# Patient Record
Sex: Female | Born: 1960 | Race: White | Hispanic: No | Marital: Married | Smoking: Never smoker
Health system: Southern US, Community
[De-identification: ages and names within clinical notes are randomized; demographics above are authoritative.]

## PROBLEM LIST (undated history)

## (undated) DIAGNOSIS — E039 Hypothyroidism, unspecified: Secondary | ICD-10-CM

## (undated) DIAGNOSIS — I10 Essential (primary) hypertension: Secondary | ICD-10-CM

## (undated) DIAGNOSIS — E079 Disorder of thyroid, unspecified: Secondary | ICD-10-CM

---

## 2008-06-30 ENCOUNTER — Emergency Department (HOSPITAL_COMMUNITY): Admission: EM | Admit: 2008-06-30 | Discharge: 2008-06-30 | Payer: Self-pay | Admitting: Family Medicine

## 2011-11-11 ENCOUNTER — Other Ambulatory Visit: Payer: Self-pay | Admitting: Family Medicine

## 2011-11-11 DIAGNOSIS — Z1231 Encounter for screening mammogram for malignant neoplasm of breast: Secondary | ICD-10-CM

## 2011-12-10 ENCOUNTER — Ambulatory Visit
Admission: RE | Admit: 2011-12-10 | Discharge: 2011-12-10 | Disposition: A | Discharge status: Home / Self Care | Payer: BC Managed Care – PPO | Source: Ambulatory Visit | Attending: Family Medicine | Admitting: Family Medicine

## 2011-12-10 DIAGNOSIS — Z1231 Encounter for screening mammogram for malignant neoplasm of breast: Secondary | ICD-10-CM

## 2012-11-10 ENCOUNTER — Other Ambulatory Visit: Payer: Self-pay

## 2012-11-10 DIAGNOSIS — Z1231 Encounter for screening mammogram for malignant neoplasm of breast: Secondary | ICD-10-CM

## 2012-12-12 ENCOUNTER — Ambulatory Visit
Admission: RE | Admit: 2012-12-12 | Discharge: 2012-12-12 | Disposition: A | Discharge status: Home / Self Care | Payer: BC Managed Care – PPO | Source: Ambulatory Visit

## 2012-12-12 DIAGNOSIS — Z1231 Encounter for screening mammogram for malignant neoplasm of breast: Secondary | ICD-10-CM

## 2013-01-05 ENCOUNTER — Other Ambulatory Visit: Payer: Self-pay | Admitting: Family Medicine

## 2013-01-05 ENCOUNTER — Other Ambulatory Visit (HOSPITAL_COMMUNITY)
Admission: RE | Admit: 2013-01-05 | Discharge: 2013-01-05 | Disposition: A | Discharge status: Home / Self Care | Payer: BC Managed Care – PPO | Source: Ambulatory Visit | Attending: Family Medicine | Admitting: Family Medicine

## 2013-01-05 DIAGNOSIS — Z1151 Encounter for screening for human papillomavirus (HPV): Secondary | ICD-10-CM | POA: Insufficient documentation

## 2013-01-05 DIAGNOSIS — Z01419 Encounter for gynecological examination (general) (routine) without abnormal findings: Secondary | ICD-10-CM | POA: Insufficient documentation

## 2013-11-30 ENCOUNTER — Other Ambulatory Visit: Payer: Self-pay

## 2013-11-30 DIAGNOSIS — Z1231 Encounter for screening mammogram for malignant neoplasm of breast: Secondary | ICD-10-CM

## 2013-12-14 ENCOUNTER — Ambulatory Visit
Admission: RE | Admit: 2013-12-14 | Discharge: 2013-12-14 | Disposition: A | Discharge status: Home / Self Care | Payer: BC Managed Care – PPO | Source: Ambulatory Visit

## 2013-12-14 DIAGNOSIS — Z1231 Encounter for screening mammogram for malignant neoplasm of breast: Secondary | ICD-10-CM

## 2015-01-23 ENCOUNTER — Other Ambulatory Visit: Payer: Self-pay

## 2015-01-23 DIAGNOSIS — Z1231 Encounter for screening mammogram for malignant neoplasm of breast: Secondary | ICD-10-CM

## 2015-02-25 ENCOUNTER — Ambulatory Visit: Payer: Self-pay

## 2015-03-04 ENCOUNTER — Ambulatory Visit
Admission: RE | Admit: 2015-03-04 | Discharge: 2015-03-04 | Disposition: A | Discharge status: Home / Self Care | Payer: BLUE CROSS/BLUE SHIELD | Source: Ambulatory Visit

## 2015-03-04 DIAGNOSIS — Z1231 Encounter for screening mammogram for malignant neoplasm of breast: Secondary | ICD-10-CM

## 2015-10-24 DIAGNOSIS — M4317 Spondylolisthesis, lumbosacral region: Secondary | ICD-10-CM | POA: Diagnosis not present

## 2015-10-28 DIAGNOSIS — M4317 Spondylolisthesis, lumbosacral region: Secondary | ICD-10-CM | POA: Diagnosis not present

## 2015-10-31 DIAGNOSIS — M4317 Spondylolisthesis, lumbosacral region: Secondary | ICD-10-CM | POA: Diagnosis not present

## 2015-11-05 DIAGNOSIS — M4317 Spondylolisthesis, lumbosacral region: Secondary | ICD-10-CM | POA: Diagnosis not present

## 2015-11-08 DIAGNOSIS — M4317 Spondylolisthesis, lumbosacral region: Secondary | ICD-10-CM | POA: Diagnosis not present

## 2015-11-12 DIAGNOSIS — M4317 Spondylolisthesis, lumbosacral region: Secondary | ICD-10-CM | POA: Diagnosis not present

## 2015-11-19 DIAGNOSIS — M4317 Spondylolisthesis, lumbosacral region: Secondary | ICD-10-CM | POA: Diagnosis not present

## 2015-11-22 DIAGNOSIS — M4317 Spondylolisthesis, lumbosacral region: Secondary | ICD-10-CM | POA: Diagnosis not present

## 2015-11-26 DIAGNOSIS — M4317 Spondylolisthesis, lumbosacral region: Secondary | ICD-10-CM | POA: Diagnosis not present

## 2015-12-10 DIAGNOSIS — M4317 Spondylolisthesis, lumbosacral region: Secondary | ICD-10-CM | POA: Diagnosis not present

## 2015-12-17 DIAGNOSIS — M4317 Spondylolisthesis, lumbosacral region: Secondary | ICD-10-CM | POA: Diagnosis not present

## 2015-12-25 DIAGNOSIS — M4317 Spondylolisthesis, lumbosacral region: Secondary | ICD-10-CM | POA: Diagnosis not present

## 2016-01-08 DIAGNOSIS — M4317 Spondylolisthesis, lumbosacral region: Secondary | ICD-10-CM | POA: Diagnosis not present

## 2016-01-17 DIAGNOSIS — E782 Mixed hyperlipidemia: Secondary | ICD-10-CM | POA: Diagnosis not present

## 2016-01-17 DIAGNOSIS — I10 Essential (primary) hypertension: Secondary | ICD-10-CM | POA: Diagnosis not present

## 2016-01-17 DIAGNOSIS — E559 Vitamin D deficiency, unspecified: Secondary | ICD-10-CM | POA: Diagnosis not present

## 2016-01-23 DIAGNOSIS — E559 Vitamin D deficiency, unspecified: Secondary | ICD-10-CM | POA: Diagnosis not present

## 2016-01-23 DIAGNOSIS — E782 Mixed hyperlipidemia: Secondary | ICD-10-CM | POA: Diagnosis not present

## 2016-01-23 DIAGNOSIS — I10 Essential (primary) hypertension: Secondary | ICD-10-CM | POA: Diagnosis not present

## 2016-01-23 DIAGNOSIS — Z0000 Encounter for general adult medical examination without abnormal findings: Secondary | ICD-10-CM | POA: Diagnosis not present

## 2016-01-29 ENCOUNTER — Other Ambulatory Visit: Payer: Self-pay | Admitting: Family Medicine

## 2016-01-29 DIAGNOSIS — Z1231 Encounter for screening mammogram for malignant neoplasm of breast: Secondary | ICD-10-CM

## 2016-03-04 ENCOUNTER — Ambulatory Visit
Admission: RE | Admit: 2016-03-04 | Discharge: 2016-03-04 | Disposition: A | Discharge status: Home / Self Care | Payer: BLUE CROSS/BLUE SHIELD | Source: Ambulatory Visit | Attending: Family Medicine | Admitting: Family Medicine

## 2016-03-04 DIAGNOSIS — Z1231 Encounter for screening mammogram for malignant neoplasm of breast: Secondary | ICD-10-CM | POA: Diagnosis not present

## 2016-07-30 DIAGNOSIS — L570 Actinic keratosis: Secondary | ICD-10-CM | POA: Diagnosis not present

## 2016-07-30 DIAGNOSIS — D229 Melanocytic nevi, unspecified: Secondary | ICD-10-CM | POA: Diagnosis not present

## 2016-07-30 DIAGNOSIS — L309 Dermatitis, unspecified: Secondary | ICD-10-CM | POA: Diagnosis not present

## 2016-07-30 DIAGNOSIS — L821 Other seborrheic keratosis: Secondary | ICD-10-CM | POA: Diagnosis not present

## 2016-07-30 DIAGNOSIS — L719 Rosacea, unspecified: Secondary | ICD-10-CM | POA: Diagnosis not present

## 2017-01-22 DIAGNOSIS — E559 Vitamin D deficiency, unspecified: Secondary | ICD-10-CM | POA: Diagnosis not present

## 2017-01-22 DIAGNOSIS — I10 Essential (primary) hypertension: Secondary | ICD-10-CM | POA: Diagnosis not present

## 2017-01-22 DIAGNOSIS — E782 Mixed hyperlipidemia: Secondary | ICD-10-CM | POA: Diagnosis not present

## 2017-01-27 ENCOUNTER — Other Ambulatory Visit: Payer: Self-pay | Admitting: Family Medicine

## 2017-01-27 DIAGNOSIS — Z139 Encounter for screening, unspecified: Secondary | ICD-10-CM

## 2017-01-29 ENCOUNTER — Other Ambulatory Visit: Payer: Self-pay | Admitting: Family Medicine

## 2017-01-29 ENCOUNTER — Other Ambulatory Visit (HOSPITAL_COMMUNITY)
Admission: RE | Admit: 2017-01-29 | Discharge: 2017-01-29 | Disposition: A | Discharge status: Home / Self Care | Payer: BLUE CROSS/BLUE SHIELD | Source: Ambulatory Visit | Attending: Family Medicine | Admitting: Family Medicine

## 2017-01-29 DIAGNOSIS — Z124 Encounter for screening for malignant neoplasm of cervix: Secondary | ICD-10-CM | POA: Insufficient documentation

## 2017-01-29 DIAGNOSIS — F3342 Major depressive disorder, recurrent, in full remission: Secondary | ICD-10-CM | POA: Diagnosis not present

## 2017-01-29 DIAGNOSIS — Z780 Asymptomatic menopausal state: Secondary | ICD-10-CM | POA: Diagnosis not present

## 2017-01-29 DIAGNOSIS — Z0000 Encounter for general adult medical examination without abnormal findings: Secondary | ICD-10-CM | POA: Diagnosis not present

## 2017-01-29 DIAGNOSIS — E782 Mixed hyperlipidemia: Secondary | ICD-10-CM | POA: Diagnosis not present

## 2017-01-29 DIAGNOSIS — Z1211 Encounter for screening for malignant neoplasm of colon: Secondary | ICD-10-CM | POA: Diagnosis not present

## 2017-01-29 DIAGNOSIS — I10 Essential (primary) hypertension: Secondary | ICD-10-CM | POA: Diagnosis not present

## 2017-02-02 LAB — CYTOLOGY - PAP: DIAGNOSIS: NEGATIVE

## 2017-02-10 DIAGNOSIS — Z1211 Encounter for screening for malignant neoplasm of colon: Secondary | ICD-10-CM | POA: Diagnosis not present

## 2017-03-05 ENCOUNTER — Ambulatory Visit
Admission: RE | Admit: 2017-03-05 | Discharge: 2017-03-05 | Disposition: A | Discharge status: Home / Self Care | Payer: BLUE CROSS/BLUE SHIELD | Source: Ambulatory Visit | Attending: Family Medicine | Admitting: Family Medicine

## 2017-03-05 ENCOUNTER — Encounter: Payer: Self-pay | Admitting: Radiology

## 2017-03-05 DIAGNOSIS — Z1231 Encounter for screening mammogram for malignant neoplasm of breast: Secondary | ICD-10-CM | POA: Diagnosis not present

## 2017-03-05 DIAGNOSIS — Z139 Encounter for screening, unspecified: Secondary | ICD-10-CM

## 2017-03-25 ENCOUNTER — Other Ambulatory Visit (HOSPITAL_COMMUNITY)
Admission: RE | Admit: 2017-03-25 | Discharge: 2017-03-25 | Disposition: A | Discharge status: Home / Self Care | Payer: BLUE CROSS/BLUE SHIELD | Source: Ambulatory Visit | Attending: Family Medicine | Admitting: Family Medicine

## 2017-03-25 ENCOUNTER — Other Ambulatory Visit: Payer: Self-pay | Admitting: Family Medicine

## 2017-03-25 DIAGNOSIS — R87615 Unsatisfactory cytologic smear of cervix: Secondary | ICD-10-CM | POA: Diagnosis not present

## 2017-03-29 LAB — CYTOLOGY - PAP: DIAGNOSIS: NEGATIVE

## 2017-08-12 DIAGNOSIS — L719 Rosacea, unspecified: Secondary | ICD-10-CM | POA: Diagnosis not present

## 2017-08-12 DIAGNOSIS — D485 Neoplasm of uncertain behavior of skin: Secondary | ICD-10-CM | POA: Diagnosis not present

## 2017-08-12 DIAGNOSIS — D229 Melanocytic nevi, unspecified: Secondary | ICD-10-CM | POA: Diagnosis not present

## 2017-09-01 DIAGNOSIS — L509 Urticaria, unspecified: Secondary | ICD-10-CM | POA: Diagnosis not present

## 2017-09-05 DIAGNOSIS — Z6827 Body mass index (BMI) 27.0-27.9, adult: Secondary | ICD-10-CM | POA: Diagnosis not present

## 2017-09-05 DIAGNOSIS — M546 Pain in thoracic spine: Secondary | ICD-10-CM | POA: Diagnosis not present

## 2018-02-03 ENCOUNTER — Other Ambulatory Visit: Payer: Self-pay | Admitting: Family Medicine

## 2018-02-03 DIAGNOSIS — Z1231 Encounter for screening mammogram for malignant neoplasm of breast: Secondary | ICD-10-CM

## 2018-03-15 DIAGNOSIS — Z1211 Encounter for screening for malignant neoplasm of colon: Secondary | ICD-10-CM | POA: Diagnosis not present

## 2018-03-15 DIAGNOSIS — F3342 Major depressive disorder, recurrent, in full remission: Secondary | ICD-10-CM | POA: Diagnosis not present

## 2018-03-15 DIAGNOSIS — Z780 Asymptomatic menopausal state: Secondary | ICD-10-CM | POA: Diagnosis not present

## 2018-03-15 DIAGNOSIS — E559 Vitamin D deficiency, unspecified: Secondary | ICD-10-CM | POA: Diagnosis not present

## 2018-03-15 DIAGNOSIS — I10 Essential (primary) hypertension: Secondary | ICD-10-CM | POA: Diagnosis not present

## 2018-03-15 DIAGNOSIS — Z0000 Encounter for general adult medical examination without abnormal findings: Secondary | ICD-10-CM | POA: Diagnosis not present

## 2018-03-15 DIAGNOSIS — E782 Mixed hyperlipidemia: Secondary | ICD-10-CM | POA: Diagnosis not present

## 2018-03-17 ENCOUNTER — Ambulatory Visit: Payer: BLUE CROSS/BLUE SHIELD

## 2018-03-24 ENCOUNTER — Other Ambulatory Visit: Payer: Self-pay | Admitting: Family Medicine

## 2018-03-24 DIAGNOSIS — M8580 Other specified disorders of bone density and structure, unspecified site: Secondary | ICD-10-CM

## 2018-04-19 ENCOUNTER — Ambulatory Visit: Payer: BLUE CROSS/BLUE SHIELD

## 2018-05-17 ENCOUNTER — Other Ambulatory Visit: Payer: BLUE CROSS/BLUE SHIELD

## 2018-05-17 ENCOUNTER — Ambulatory Visit: Payer: BLUE CROSS/BLUE SHIELD

## 2018-07-15 ENCOUNTER — Other Ambulatory Visit: Payer: BLUE CROSS/BLUE SHIELD

## 2018-07-15 ENCOUNTER — Ambulatory Visit: Payer: BLUE CROSS/BLUE SHIELD

## 2018-10-07 ENCOUNTER — Ambulatory Visit
Admission: RE | Admit: 2018-10-07 | Discharge: 2018-10-07 | Disposition: A | Discharge status: Home / Self Care | Payer: BC Managed Care – PPO | Source: Ambulatory Visit | Attending: Family Medicine | Admitting: Family Medicine

## 2018-10-07 ENCOUNTER — Other Ambulatory Visit: Payer: Self-pay

## 2018-10-07 DIAGNOSIS — M8589 Other specified disorders of bone density and structure, multiple sites: Secondary | ICD-10-CM | POA: Diagnosis not present

## 2018-10-07 DIAGNOSIS — Z780 Asymptomatic menopausal state: Secondary | ICD-10-CM | POA: Diagnosis not present

## 2018-10-07 DIAGNOSIS — Z1231 Encounter for screening mammogram for malignant neoplasm of breast: Secondary | ICD-10-CM

## 2018-10-07 DIAGNOSIS — M8580 Other specified disorders of bone density and structure, unspecified site: Secondary | ICD-10-CM

## 2018-10-10 ENCOUNTER — Ambulatory Visit: Payer: BLUE CROSS/BLUE SHIELD

## 2018-10-10 ENCOUNTER — Other Ambulatory Visit: Payer: BLUE CROSS/BLUE SHIELD

## 2018-11-15 DIAGNOSIS — E782 Mixed hyperlipidemia: Secondary | ICD-10-CM | POA: Diagnosis not present

## 2018-11-15 DIAGNOSIS — R7989 Other specified abnormal findings of blood chemistry: Secondary | ICD-10-CM | POA: Diagnosis not present

## 2018-12-29 DIAGNOSIS — L03116 Cellulitis of left lower limb: Secondary | ICD-10-CM | POA: Diagnosis not present

## 2019-02-07 ENCOUNTER — Other Ambulatory Visit: Payer: Self-pay | Admitting: Dermatology

## 2019-02-07 DIAGNOSIS — D485 Neoplasm of uncertain behavior of skin: Secondary | ICD-10-CM | POA: Diagnosis not present

## 2019-02-07 DIAGNOSIS — L570 Actinic keratosis: Secondary | ICD-10-CM | POA: Diagnosis not present

## 2019-02-07 DIAGNOSIS — L52 Erythema nodosum: Secondary | ICD-10-CM | POA: Diagnosis not present

## 2019-03-30 DIAGNOSIS — E782 Mixed hyperlipidemia: Secondary | ICD-10-CM | POA: Diagnosis not present

## 2019-03-30 DIAGNOSIS — E559 Vitamin D deficiency, unspecified: Secondary | ICD-10-CM | POA: Diagnosis not present

## 2019-03-30 DIAGNOSIS — R7989 Other specified abnormal findings of blood chemistry: Secondary | ICD-10-CM | POA: Diagnosis not present

## 2019-04-04 DIAGNOSIS — Z23 Encounter for immunization: Secondary | ICD-10-CM | POA: Diagnosis not present

## 2019-04-04 DIAGNOSIS — I10 Essential (primary) hypertension: Secondary | ICD-10-CM | POA: Diagnosis not present

## 2019-04-04 DIAGNOSIS — Z0001 Encounter for general adult medical examination with abnormal findings: Secondary | ICD-10-CM | POA: Diagnosis not present

## 2019-04-04 DIAGNOSIS — F3342 Major depressive disorder, recurrent, in full remission: Secondary | ICD-10-CM | POA: Diagnosis not present

## 2019-04-04 DIAGNOSIS — E782 Mixed hyperlipidemia: Secondary | ICD-10-CM | POA: Diagnosis not present

## 2019-04-04 DIAGNOSIS — E039 Hypothyroidism, unspecified: Secondary | ICD-10-CM | POA: Diagnosis not present

## 2019-04-05 DIAGNOSIS — Z1211 Encounter for screening for malignant neoplasm of colon: Secondary | ICD-10-CM | POA: Diagnosis not present

## 2019-05-16 DIAGNOSIS — I10 Essential (primary) hypertension: Secondary | ICD-10-CM | POA: Diagnosis not present

## 2019-05-16 DIAGNOSIS — E039 Hypothyroidism, unspecified: Secondary | ICD-10-CM | POA: Diagnosis not present

## 2019-06-01 ENCOUNTER — Ambulatory Visit: Payer: BC Managed Care – PPO | Attending: Internal Medicine

## 2019-06-01 DIAGNOSIS — Z23 Encounter for immunization: Secondary | ICD-10-CM | POA: Insufficient documentation

## 2019-06-01 NOTE — Progress Notes (Signed)
   Covid-19 Vaccination Clinic  Name:  Belinda Miller    MRN: [id number] DOB: 1960/11/06  06/01/2019  Belinda Miller was observed post Covid-19 immunization for 15 minutes without incident. She was provided with Vaccine Information Sheet and instruction to access the V-Safe system.   Belinda Miller was instructed to call 911 with any severe reactions post vaccine: Marland Kitchen Difficulty breathing  . Swelling of face and throat  . A fast heartbeat  . A bad rash all over body  . Dizziness and weakness   Immunizations Administered    Name Date Dose VIS Date Route   Moderna COVID-19 Vaccine 06/01/2019 10:07 AM 0.5 mL 02/28/2019 Intramuscular   Manufacturer: Moderna   Lot: [id number]   [id number]

## 2019-06-20 ENCOUNTER — Other Ambulatory Visit: Payer: Self-pay

## 2019-06-20 ENCOUNTER — Ambulatory Visit (INDEPENDENT_AMBULATORY_CARE_PROVIDER_SITE_OTHER): Payer: BC Managed Care – PPO | Admitting: Physician Assistant

## 2019-06-20 ENCOUNTER — Encounter: Payer: Self-pay | Admitting: Physician Assistant

## 2019-06-20 DIAGNOSIS — D492 Neoplasm of unspecified behavior of bone, soft tissue, and skin: Secondary | ICD-10-CM

## 2019-06-20 DIAGNOSIS — C4491 Basal cell carcinoma of skin, unspecified: Secondary | ICD-10-CM

## 2019-06-20 DIAGNOSIS — L710 Perioral dermatitis: Secondary | ICD-10-CM | POA: Diagnosis not present

## 2019-06-20 DIAGNOSIS — L2084 Intrinsic (allergic) eczema: Secondary | ICD-10-CM

## 2019-06-20 DIAGNOSIS — C4441 Basal cell carcinoma of skin of scalp and neck: Secondary | ICD-10-CM

## 2019-06-20 HISTORY — DX: Basal cell carcinoma of skin, unspecified: C44.91

## 2019-06-20 MED ORDER — MINOCYCLINE HCL 100 MG PO CAPS: 100.0000 mg | ORAL_CAPSULE | Freq: Two times a day (BID) | ORAL | 3 refills | Status: DC

## 2019-06-20 MED ORDER — TRIAMCINOLONE ACETONIDE 0.1 % EX CREA: 1.0000 "application " | TOPICAL_CREAM | Freq: Two times a day (BID) | CUTANEOUS | 6 refills | Status: AC

## 2019-06-20 NOTE — Progress Notes (Signed)
Refill minoccyline 100mg  #60 bid  due to face broke out again and it worked in the past. Legs itchy treatment otc lotion x months.

## 2019-06-20 NOTE — Progress Notes (Signed)
   Follow-Up Visit   Subjective  Belinda Miller is a 59 y.o. female who presents for the following: Skin Problem (Face acne refill minocycline 100 mg #60 bid. Also legs itchy otc lotion x months).   Location: legs mostle Duration: months Quality: pink scales scattered  Associated Signs/Symptoms: Modifying Factors: lotion not helping Severity: little less since taking synthroid Timing:worse at night Context: hypothyroidism   The following portions of the chart were reviewed this encounter and updated as appropriate: Tobacco  Allergies  Meds  Problems  Med Hx  Surg Hx  Fam Hx      Objective  Well appearing patient in no apparent distress; mood and affect are within normal limits.  A full examination was performed including scalp, head, eyes, ears, nose, lips, neck, chest, axillae, abdomen, back, buttocks, bilateral upper extremities, bilateral lower extremities, hands, feet, fingers, toes, fingernails, and toenails. All findings within normal limits unless otherwise noted below.  Left Lower Leg - Anterior; Right Lower Leg - Anterior (2); Left Lower Leg - Posterior (2)  Objective  Left Lower Leg - Posterior: Xerosis with pink patches  Left Lower Leg - Posterior: Xerosis with pink patches  Left Lower Leg - Anterior: Xerosis with pink patches  Right Lower Leg - Anterior: Xerosis with pink patches  Right Lower Leg - Anterior: Xerosis with pink patches  Objective  Left side neck: Pearly papule with telangectasia.      Objective  Right Buccal Cheek  (2), Right Lower Cutaneous Lip, Right Upper Cutaneous Lip: Pink papules in patch.6-8 wks   Assessment & Plan  Intrinsic (allergic) eczema (5) Left Lower Leg - Anterior; Right Lower Leg - Anterior (2); Left Lower Leg - Posterior (2)  Neoplasm of skin Left side neck  Skin / nail biopsy Type of biopsy: tangential   Informed consent: discussed and consent obtained   Timeout: patient name, date of birth, surgical  site, and procedure verified   Anesthesia: the lesion was anesthetized in a standard fashion   Anesthetic:  1% lidocaine w/ epinephrine 1-100,000 local infiltration Instrument used: flexible razor blade   Hemostasis achieved with: aluminum chloride and electrodesiccation   Outcome: patient tolerated procedure well   Post-procedure details: wound care instructions given    Specimen 1 - Surgical pathology Differential Diagnosis: BCC Check Margins: No  Perioral dermatitis (4) Right Upper Cutaneous Lip; Right Lower Cutaneous Lip; Right Buccal Cheek  (2)  minocycline (MINOCIN) 100 MG capsule - Right Buccal Cheek  (2), Right Lower Cutaneous Lip, Right Upper Cutaneous Lip

## 2019-06-20 NOTE — Patient Instructions (Signed)

## 2019-06-22 ENCOUNTER — Telehealth: Payer: Self-pay

## 2019-06-22 NOTE — Telephone Encounter (Signed)
Path results to patient and 72minute surgery with [facility] scheduled for 07/28/2019 @ 11:30am.

## 2019-06-22 NOTE — Telephone Encounter (Signed)
-----   Message from Warren Danes, [place] sent at 06/22/2019  9:49 AM EDT ----- BASAL CELL CARCINOMA, NODULAR PATTERN- neck  Schedule  30   ED&C

## 2019-07-04 ENCOUNTER — Ambulatory Visit: Payer: BC Managed Care – PPO | Attending: Internal Medicine

## 2019-07-04 DIAGNOSIS — Z23 Encounter for immunization: Secondary | ICD-10-CM

## 2019-07-04 NOTE — Progress Notes (Signed)
   Covid-19 Vaccination Clinic  Name:  Belinda Miller    MRN: [id number] DOB: Jan 15, 1961  07/04/2019  [name] was observed post Covid-19 immunization for 15 minutes without incident. She was provided with Vaccine Information Sheet and instruction to access the V-Safe system.    [name] was instructed to call 911 with any severe reactions post vaccine: Marland Kitchen Difficulty breathing  . Swelling of face and throat  . A fast heartbeat  . A bad rash all over body  . Dizziness and weakness   Immunizations Administered    Name Date Dose VIS Date Route   Moderna COVID-19 Vaccine 07/04/2019  8:53 AM 0.5 mL 02/28/2019 Intramuscular   Manufacturer: Moderna   [id number]   [place] [id number]

## 2019-07-10 ENCOUNTER — Telehealth: Payer: Self-pay

## 2019-07-10 NOTE — Telephone Encounter (Signed)
Patient called stating she will be losing her insurance on 07/21/2019 and she has an appointment with Robyne Askew on 07/28/2019 for treatment of Belinda Miller.  Patient wants to know if there's anyway Vida Roller can get her in before 07/21/2019.  I informed patient I couldn't double book Kelli's appointments I would have to speak with [facility] on tomorrow when she's back in the office.  Patient okay with that.

## 2019-07-11 NOTE — Telephone Encounter (Signed)
Phone call to patient per [facility] request to get her seen sooner.  Appointment made for 07/20/2019 @1pm .  Patient aware of appointment.

## 2019-07-11 NOTE — Telephone Encounter (Signed)
Find her a spot at end of day. Not fridays please

## 2019-07-20 ENCOUNTER — Encounter: Payer: Self-pay | Admitting: *Deleted

## 2019-07-20 ENCOUNTER — Encounter: Payer: Self-pay | Admitting: Physician Assistant

## 2019-07-20 ENCOUNTER — Ambulatory Visit (INDEPENDENT_AMBULATORY_CARE_PROVIDER_SITE_OTHER): Payer: BC Managed Care – PPO | Admitting: Physician Assistant

## 2019-07-20 ENCOUNTER — Other Ambulatory Visit: Payer: Self-pay

## 2019-07-20 DIAGNOSIS — C4441 Basal cell carcinoma of skin of scalp and neck: Secondary | ICD-10-CM | POA: Diagnosis not present

## 2019-07-20 NOTE — Patient Instructions (Signed)

## 2019-07-20 NOTE — Progress Notes (Addendum)
   Follow-Up Visit   Subjective  Belinda Miller is a 59 y.o. female who presents for the following: Basal Cell Carcinoma (here for treatment- left side neck- bcc). Pink spot. Healed well. No problems  The following portions of the chart were reviewed this encounter and updated as appropriate: Meds  Problems      Objective  Well appearing patient in no apparent distress; mood and affect are within normal limits.  A focused examination was performed including neck. Relevant physical exam findings are noted in the Assessment and Plan.  Objective  Left side Neck: 1.4 cm pearly papule with arborizing vessels.   Assessment & Plan  Basal cell carcinoma (BCC) of skin of neck Left side Neck  Epidermal / dermal shaving  Informed consent: discussed and consent obtained   Timeout: patient name, date of birth, surgical site, and procedure verified   Procedure prep:  Patient was prepped and draped in usual sterile fashion Prep type:  Chlorhexidine Anesthesia: the lesion was anesthetized in a standard fashion   Anesthetic:  1% lidocaine w/ epinephrine 1-100,000 local infiltration Instrument used: flexible razor blade   Outcome: patient tolerated procedure well   Post-procedure details: wound care instructions given    Specimen 1 - Surgical pathology Differential Diagnosis:  Check Margins: No 1.4 cm pearly papule with arborizing vessels.

## 2019-07-28 ENCOUNTER — Encounter: Payer: BC Managed Care – PPO | Admitting: Physician Assistant

## 2019-08-07 NOTE — Addendum Note (Signed)
Addended by: Robyne Askew R on: 08/07/2019 12:07 PM   Modules accepted: Orders, Level of Service

## 2019-08-22 ENCOUNTER — Ambulatory Visit (INDEPENDENT_AMBULATORY_CARE_PROVIDER_SITE_OTHER): Payer: Self-pay

## 2019-08-22 ENCOUNTER — Ambulatory Visit (HOSPITAL_COMMUNITY)
Admission: EM | Admit: 2019-08-22 | Discharge: 2019-08-22 | Disposition: A | Discharge status: Home / Self Care | Payer: Self-pay | Attending: Emergency Medicine | Admitting: Emergency Medicine

## 2019-08-22 ENCOUNTER — Other Ambulatory Visit: Payer: Self-pay

## 2019-08-22 ENCOUNTER — Encounter (HOSPITAL_COMMUNITY): Payer: Self-pay | Admitting: Orthopedic Surgery

## 2019-08-22 DIAGNOSIS — R079 Chest pain, unspecified: Secondary | ICD-10-CM | POA: Insufficient documentation

## 2019-08-22 DIAGNOSIS — J029 Acute pharyngitis, unspecified: Secondary | ICD-10-CM | POA: Insufficient documentation

## 2019-08-22 DIAGNOSIS — Z79899 Other long term (current) drug therapy: Secondary | ICD-10-CM | POA: Insufficient documentation

## 2019-08-22 DIAGNOSIS — J189 Pneumonia, unspecified organism: Secondary | ICD-10-CM

## 2019-08-22 DIAGNOSIS — R5383 Other fatigue: Secondary | ICD-10-CM | POA: Insufficient documentation

## 2019-08-22 DIAGNOSIS — I10 Essential (primary) hypertension: Secondary | ICD-10-CM | POA: Insufficient documentation

## 2019-08-22 DIAGNOSIS — Z7901 Long term (current) use of anticoagulants: Secondary | ICD-10-CM | POA: Insufficient documentation

## 2019-08-22 DIAGNOSIS — R509 Fever, unspecified: Secondary | ICD-10-CM | POA: Insufficient documentation

## 2019-08-22 DIAGNOSIS — Z20822 Contact with and (suspected) exposure to covid-19: Secondary | ICD-10-CM | POA: Insufficient documentation

## 2019-08-22 DIAGNOSIS — H9209 Otalgia, unspecified ear: Secondary | ICD-10-CM | POA: Insufficient documentation

## 2019-08-22 DIAGNOSIS — R531 Weakness: Secondary | ICD-10-CM | POA: Insufficient documentation

## 2019-08-22 DIAGNOSIS — E039 Hypothyroidism, unspecified: Secondary | ICD-10-CM | POA: Insufficient documentation

## 2019-08-22 HISTORY — DX: Disorder of thyroid, unspecified: E07.9

## 2019-08-22 HISTORY — DX: Essential (primary) hypertension: I10

## 2019-08-22 LAB — POCT RAPID STREP A: Streptococcus, Group A Screen (Direct): NEGATIVE

## 2019-08-22 MED ORDER — IBUPROFEN 600 MG PO TABS: 600.0000 mg | ORAL_TABLET | Freq: Four times a day (QID) | ORAL | 0 refills | Status: DC | PRN

## 2019-08-22 MED ORDER — AZITHROMYCIN 250 MG PO TABS: 250.0000 mg | ORAL_TABLET | Freq: Every day | ORAL | 0 refills | Status: DC

## 2019-08-22 MED ORDER — AMOXICILLIN-POT CLAVULANATE 875-125 MG PO TABS: 1.0000 | ORAL_TABLET | Freq: Two times a day (BID) | ORAL | 0 refills | Status: AC

## 2019-08-22 MED ORDER — BENZONATATE 200 MG PO CAPS: 200.0000 mg | ORAL_CAPSULE | Freq: Three times a day (TID) | ORAL | 0 refills | Status: AC | PRN

## 2019-08-22 NOTE — Discharge Instructions (Signed)
There was signs of early pneumonia on your chest x-ray Begin Augmentin twice daily for the next week and azithromycin-2 tablets today, 1 tablet for the following 4 days Continue Tylenol and ibuprofen to help with any fevers, body aches and chest discomfort Rest and drink plenty of fluids Honey and hot tea

## 2019-08-22 NOTE — ED Provider Notes (Signed)
[name]    CSN: [id number] Arrival date & time: 08/22/19  [id number]      History   Chief Complaint Chief Complaint  Patient presents with  . Fever  . Sore Throat  . Chest Pain  . Fatigue  Fever, Fatigue, Sore throat  HPI Belinda Miller is a 58 y.o. female history of hypertension, hypothyroid, presenting today for evaluation of fever, fatigue and sore throat.  Patient reports that yesterday she woke up with a discomfort in her chest and back.  Initially related this to a muscle strain as she was carrying a heavy bag of dirt and working in the yard prior to this.  Since she has also developed significant fatigue and has felt generalized weakness and sore throat.  She denies significant rhinorrhea or cough.  Has had some slight ear pain beginning today.  Has been fully vaccinated for Covid.  Denies GI symptoms.  HPI  Past Medical History:  Diagnosis Date  . Basal cell carcinoma 06/20/2019   left side neck(CX35FU)  . Hypertension   . Thyroid disease     There are no problems to display for this patient.   History reviewed. No pertinent surgical history.  OB History   No obstetric history on file.      Home Medications    Prior to Admission medications   Medication Sig Start Date End Date Taking? Authorizing Provider  calcium carbonate (OSCAL) 1500 (600 Ca) MG TABS tablet Take by mouth 2 (two) times daily with a meal.   Yes [provider]  cholecalciferol (VITAMIN D3) 25 MCG (1000 UNIT) tablet Take 1,000 Units by mouth daily.   Yes [provider]  levothyroxine (SYNTHROID) 50 MCG tablet Take 50 mcg by mouth daily before breakfast.   Yes [provider]  lisinopril (ZESTRIL) 10 MG tablet Take 10 mg by mouth daily.   Yes [provider]  minocycline (MINOCIN) 100 MG capsule Take 1 capsule (100 mg total) by mouth in the morning and at bedtime. 06/20/19  Yes Sheffield, Kelli R, PA-C  PARoxetine (PAXIL) 20 MG tablet Take 20 mg by  mouth daily.   Yes [provider]  raloxifene (EVISTA) 60 MG tablet Take 60 mg by mouth daily.   Yes [provider]  simvastatin (ZOCOR) 20 MG tablet Take 20 mg by mouth daily.   Yes [provider]  amoxicillin-clavulanate (AUGMENTIN) 875-125 MG tablet Take 1 tablet by mouth every 12 (twelve) hours for 7 days. 08/22/19 08/29/19  Wieters, Hallie C, PA-C  azithromycin (ZITHROMAX) 250 MG tablet Take 1 tablet (250 mg total) by mouth daily. Take first 2 tablets together, then 1 every day until finished. 08/22/19   Wieters, Hallie C, PA-C  benzonatate (TESSALON) 200 MG capsule Take 1 capsule (200 mg total) by mouth 3 (three) times daily as needed for up to 7 days for cough. 08/22/19 08/29/19  Wieters, Hallie C, PA-C  ibuprofen (ADVIL) 600 MG tablet Take 1 tablet (600 mg total) by mouth every 6 (six) hours as needed. 08/22/19   Wieters, Hallie C, PA-C  Omega-3 Fatty Acids (FISH OIL) 500 MG CAPS Take by mouth.    [provider]  triamcinolone cream (KENALOG) 0.1 % Apply 1 application topically 2 (two) times daily. 06/20/19   Warren Danes, PA-C    Family History Family History  Problem Relation Age of Onset  . Cancer Mother   . Cancer Father     Social History Social History   Tobacco  Use  . Smoking status: Never Smoker  . Smokeless tobacco: Never Used  Substance Use Topics  . Alcohol use: Yes    Alcohol/week: 1.0 standard drinks    Types: 1 Glasses of wine per week  . Drug use: Never     Allergies   Patient has no known allergies.   Review of Systems Review of Systems  Constitutional: Positive for chills, fatigue and fever. Negative for activity change and appetite change.  HENT: Positive for sore throat. Negative for congestion, ear pain, rhinorrhea, sinus pressure and trouble swallowing.   Eyes: Negative for discharge and redness.  Respiratory: Positive for cough. Negative for chest tightness and shortness of breath.   Cardiovascular: Positive  for chest pain.  Gastrointestinal: Negative for abdominal pain, diarrhea, nausea and vomiting.  Musculoskeletal: Positive for myalgias.  Skin: Negative for rash.  Neurological: Negative for dizziness, light-headedness and headaches.     Physical Exam Triage Vital Signs ED Triage Vitals  Enc Vitals Group     BP 08/22/19 0848 (!) 157/78     Pulse Rate 08/22/19 0848 (!) 102     Resp 08/22/19 0848 18     Temp 08/22/19 0848 100.1 F (37.8 C)     Temp Source 08/22/19 0848 Oral     SpO2 08/22/19 0848 100 %     Weight --      Height --      Head Circumference --      Peak Flow --      Pain Score 08/22/19 0849 7     Pain Loc --      Pain Edu? --      Excl. in [place]? --    No data found.  Updated Vital Signs BP (!) 157/78 (BP Location: Right Arm)   Pulse (!) 102   Temp 100.1 F (37.8 C) (Oral)   Resp 18   SpO2 100%   Visual Acuity Right Eye Distance:   Left Eye Distance:   Bilateral Distance:    Right Eye Near:   Left Eye Near:    Bilateral Near:     Physical Exam Vitals and nursing note reviewed.  Constitutional:      Appearance: She is well-developed.     Comments: No acute distress  HENT:     Head: Normocephalic and atraumatic.     Ears:     Comments: Bilateral ears without tenderness to palpation of external auricle, tragus and mastoid, EAC's without erythema or swelling, TM's with good bony landmarks and cone of light. Non erythematous.     Nose: Nose normal.     Mouth/Throat:     Comments: Oral mucosa pink and moist, no tonsillar enlargement or exudate. Posterior pharynx patent and nonerythematous, no uvula deviation or swelling. Normal phonation. Eyes:     Conjunctiva/sclera: Conjunctivae normal.  Neck:     Comments: Full active ROM Cardiovascular:     Rate and Rhythm: Normal rate.  Pulmonary:     Effort: Pulmonary effort is normal. No respiratory distress.     Comments: Breathing comfortably at rest, CTABL, no wheezing, rales or other adventitious sounds  auscultated  No reproducible tenderness to palpation Abdominal:     General: There is no distension.  Musculoskeletal:        General: Normal range of motion.     Cervical back: Neck supple.  Skin:    General: Skin is warm and dry.  Neurological:     Mental Status: She is alert and oriented to person,  place, and time.      UC Treatments / Results  Labs (all labs ordered are listed, but only abnormal results are displayed) Labs Reviewed  SARS CORONAVIRUS 2 (TAT 6-24 HRS)  CULTURE, GROUP A STREP [facility])  POCT RAPID STREP A    EKG   Radiology DG Chest 2 View  Result Date: 08/22/2019 CLINICAL DATA:  Fever fatigue chest and back discomfort. EXAM: CHEST - 2 VIEW COMPARISON:  None. FINDINGS: Cardiomediastinal contours and hilar structures are normal. Streaky opacities at the lung bases LEFT greater than RIGHT particularly in the lingula. No sign of pleural effusion or dense consolidation. Visualized skeletal structures are unremarkable to the extent evaluated. IMPRESSION: Streaky opacities at the lung bases LEFT greater than RIGHT, atelectasis or developing pneumonia, particularly in the lingula. Electronically Signed   By: Zetta Bills M.D.   On: 08/22/2019 09:45    Procedures Procedures (including critical care time)  Medications Ordered in UC Medications - No data to display  Initial Impression / Assessment and Plan / UC Course  I have reviewed the triage vital signs and the nursing notes.  Pertinent labs & imaging results that were available during my care of the patient were reviewed by me and considered in my medical decision making (see chart for details).     Chest x-ray concerning for possible early pneumonia, will go ahead and start on Augmentin and azithromycin, will provide Tessalon for cough as needed, continue Tylenol and ibuprofen, rest and push fluids.  Strep test negative, sore throat more likely viral versus related to postnasal drainage.  Covid PCR pending.   Discussed strict return precautions. Patient verbalized understanding and is agreeable with plan.  Final Clinical Impressions(s) / UC Diagnoses   Final diagnoses:  Community acquired pneumonia of left lower lobe of lung     Discharge Instructions     There was signs of early pneumonia on your chest x-ray Begin Augmentin twice daily for the next week and azithromycin-2 tablets today, 1 tablet for the following 4 days Continue Tylenol and ibuprofen to help with any fevers, body aches and chest discomfort Rest and drink plenty of fluids Honey and hot tea    ED Prescriptions    Medication Sig Dispense Auth. Provider   amoxicillin-clavulanate (AUGMENTIN) 875-125 MG tablet Take 1 tablet by mouth every 12 (twelve) hours for 7 days. 14 tablet Wieters, Hallie C, PA-C   azithromycin (ZITHROMAX) 250 MG tablet Take 1 tablet (250 mg total) by mouth daily. Take first 2 tablets together, then 1 every day until finished. 6 tablet Wieters, Hallie C, PA-C   ibuprofen (ADVIL) 600 MG tablet Take 1 tablet (600 mg total) by mouth every 6 (six) hours as needed. 30 tablet Wieters, Hallie C, PA-C   benzonatate (TESSALON) 200 MG capsule Take 1 capsule (200 mg total) by mouth 3 (three) times daily as needed for up to 7 days for cough. 28 capsule Wieters, [place] C, PA-C     PDMP not reviewed this encounter.   Wieters, [place] C, PA-C 08/22/19 1012

## 2019-08-22 NOTE — ED Triage Notes (Addendum)
Pt reports yesterday when she woke up she was SOB,chest /back soreness with inspiration, chills and sore throat. Pt feels weak and tired. General malaise.  A little runny nose, today she has a headache. No n/v/d, abdominal pain, cough. Pt reports fever of 101 this morning.   Pt took Tylenol yesterday, but none today.

## 2019-08-23 LAB — SARS CORONAVIRUS 2 (TAT 6-24 HRS): SARS Coronavirus 2: NEGATIVE

## 2019-08-24 ENCOUNTER — Telehealth: Payer: Self-pay | Admitting: Physician Assistant

## 2019-08-24 LAB — CULTURE, GROUP A STREP (THRC)

## 2019-08-24 NOTE — Telephone Encounter (Signed)
Patient reports that she was seen in the [facility] office on July 21, 2019 for a biopsy and no information is showing on my chart, and says her my chart shows something posted on Aug 07, 2019 which patient reports she was not here on this day.  Patient is asking that someone call her back to clear this up. Chart [id number]

## 2019-08-25 NOTE — Telephone Encounter (Signed)
PATIENT AWARE SPOT TREATED BCC  NO REPORT/PATH TO GIVE.

## 2019-09-03 ENCOUNTER — Encounter (HOSPITAL_COMMUNITY): Payer: Self-pay

## 2019-09-03 ENCOUNTER — Ambulatory Visit (HOSPITAL_COMMUNITY)
Admission: EM | Admit: 2019-09-03 | Discharge: 2019-09-03 | Disposition: A | Discharge status: Home / Self Care | Payer: BC Managed Care – PPO | Attending: Family Medicine | Admitting: Family Medicine

## 2019-09-03 ENCOUNTER — Ambulatory Visit (INDEPENDENT_AMBULATORY_CARE_PROVIDER_SITE_OTHER): Payer: BC Managed Care – PPO

## 2019-09-03 ENCOUNTER — Other Ambulatory Visit: Payer: Self-pay

## 2019-09-03 DIAGNOSIS — M8580 Other specified disorders of bone density and structure, unspecified site: Secondary | ICD-10-CM

## 2019-09-03 DIAGNOSIS — E039 Hypothyroidism, unspecified: Secondary | ICD-10-CM

## 2019-09-03 DIAGNOSIS — F418 Other specified anxiety disorders: Secondary | ICD-10-CM

## 2019-09-03 DIAGNOSIS — I10 Essential (primary) hypertension: Secondary | ICD-10-CM

## 2019-09-03 DIAGNOSIS — R05 Cough: Secondary | ICD-10-CM

## 2019-09-03 DIAGNOSIS — R0981 Nasal congestion: Secondary | ICD-10-CM | POA: Diagnosis not present

## 2019-09-03 DIAGNOSIS — R0989 Other specified symptoms and signs involving the circulatory and respiratory systems: Secondary | ICD-10-CM

## 2019-09-03 DIAGNOSIS — J069 Acute upper respiratory infection, unspecified: Secondary | ICD-10-CM | POA: Insufficient documentation

## 2019-09-03 DIAGNOSIS — E785 Hyperlipidemia, unspecified: Secondary | ICD-10-CM

## 2019-09-03 LAB — CBC WITH DIFFERENTIAL/PLATELET
Abs Immature Granulocytes: 0.03 10*3/uL (ref 0.00–0.07)
Basophils Absolute: 0 10*3/uL (ref 0.0–0.1)
Basophils Relative: 1 %
Eosinophils Absolute: 0.1 10*3/uL (ref 0.0–0.5)
Eosinophils Relative: 2 %
HCT: 39.8 % (ref 36.0–46.0)
Hemoglobin: 12.9 g/dL (ref 12.0–15.0)
Immature Granulocytes: 0 %
Lymphocytes Relative: 15 %
Lymphs Abs: 1.1 10*3/uL (ref 0.7–4.0)
MCH: 29.7 pg (ref 26.0–34.0)
MCHC: 32.4 g/dL (ref 30.0–36.0)
MCV: 91.5 fL (ref 80.0–100.0)
Monocytes Absolute: 0.5 10*3/uL (ref 0.1–1.0)
Monocytes Relative: 7 %
Neutro Abs: 5.4 10*3/uL (ref 1.7–7.7)
Neutrophils Relative %: 75 %
Platelets: 248 10*3/uL (ref 150–400)
RBC: 4.35 MIL/uL (ref 3.87–5.11)
RDW: 13.5 % (ref 11.5–15.5)
WBC: 7.2 10*3/uL (ref 4.0–10.5)
nRBC: 0 % (ref 0.0–0.2)

## 2019-09-03 NOTE — Discharge Instructions (Addendum)
Add flonase 2 x a day for a week then once a day until well Eat well and try to get enough rest Drink lots of water Call your PCP if not improving in a week

## 2019-09-03 NOTE — ED Provider Notes (Signed)
[place]    CSN: [id number] Arrival date & time: 09/03/19  1002      History   Chief Complaint Chief Complaint  Patient presents with  . Chest Pain    HPI Belinda Miller is a 59 y.o. female.   HPI Patient was seen on 08/22/2019 for community-acquired pneumonia.  She states she woke up suddenly that morning with chest pain and and congestion.  She was given Augmentin and azithromycin.  She took both of these medications.  She felt improved but not completely well for a few days.  She is back today because now she has sinus congestion and pressure.  Right greater than left side of her face.  Ear pressure and pain on the right ear.  Fatigue.  No fever or chills.  No cough or shortness of breath.  She still has some chest pressure and pain in the center of her chest is worse with a deep breath.  She expresses concern that she still does not feel well after her pneumonia.  I did explain to her the pneumonia is a deep infection, and can make her tired for a good month.  Past Medical History:  Diagnosis Date  . Basal cell carcinoma 06/20/2019   left side neck(CX35FU)  . Hypertension   . Thyroid disease     Patient Active Problem List   Diagnosis Date Noted  . Essential hypertension 09/03/2019  . HLD (hyperlipidemia) 09/03/2019  . Osteopenia 09/03/2019  . Depression with anxiety 09/03/2019  . Hypothyroidism 09/03/2019    History reviewed. No pertinent surgical history.  OB History   No obstetric history on file.      Home Medications    Prior to Admission medications   Medication Sig Start Date End Date Taking? Authorizing Provider  calcium carbonate (OSCAL) 1500 (600 Ca) MG TABS tablet Take by mouth 2 (two) times daily with a meal.    [provider]  cholecalciferol (VITAMIN D3) 25 MCG (1000 UNIT) tablet Take 1,000 Units by mouth daily.    [provider]  ibuprofen (ADVIL) 600 MG tablet Take 1 tablet (600 mg total) by mouth every 6 (six)  hours as needed. 08/22/19   Wieters, Hallie C, PA-C  levothyroxine (SYNTHROID) 50 MCG tablet Take 50 mcg by mouth daily before breakfast.    [provider]  lisinopril (ZESTRIL) 10 MG tablet Take 10 mg by mouth daily.    [provider]  minocycline (MINOCIN) 100 MG capsule Take 1 capsule (100 mg total) by mouth in the morning and at bedtime. 06/20/19   Sheffield, Vida Roller R, PA-C  Omega-3 Fatty Acids (FISH OIL) 500 MG CAPS Take by mouth.    [provider]  PARoxetine (PAXIL) 20 MG tablet Take 20 mg by mouth daily.    [provider]  raloxifene (EVISTA) 60 MG tablet Take 60 mg by mouth daily.    [provider]  simvastatin (ZOCOR) 20 MG tablet Take 20 mg by mouth daily.    [provider]  triamcinolone cream (KENALOG) 0.1 % Apply 1 application topically 2 (two) times daily. 06/20/19   Warren Danes, PA-C    Family History Family History  Problem Relation Age of Onset  . Cancer Mother   . Cancer Father     Social History Social History   Tobacco Use  . Smoking status: Never Smoker  . Smokeless tobacco: Never Used  Substance Use Topics  . Alcohol use: Yes    Alcohol/week: 1.0  standard drinks    Types: 1 Glasses of wine per week  . Drug use: Never     Allergies   Patient has no known allergies.   Review of Systems Review of Systems  Constitutional: Positive for fatigue.  HENT: Positive for congestion, ear pain, postnasal drip, sinus pressure and sinus pain.   Cardiovascular: Positive for chest pain.     Physical Exam Triage Vital Signs ED Triage Vitals  Enc Vitals Group     BP 09/03/19 1018 130/88     Pulse Rate 09/03/19 1018 78     Resp 09/03/19 1018 18     Temp 09/03/19 1018 98.8 F (37.1 C)     Temp Source 09/03/19 1018 Oral     SpO2 09/03/19 1018 99 %     Weight 09/03/19 1020 160 lb (72.6 kg)     Height 09/03/19 1020 5\' 4"  (1.626 m)     Head Circumference --      Peak Flow --      Pain Score 09/03/19  1020 4     Pain Loc --      Pain Edu? --      Excl. in [place]? --    No data found.  Updated Vital Signs BP 130/88   Pulse 78   Temp 98.8 F (37.1 C) (Oral)   Resp 18   Ht 5\' 4"  (1.626 m)   Wt 72.6 kg   SpO2 99%   BMI 27.46 kg/m      Physical Exam Constitutional:      General: She is not in acute distress.    Appearance: Normal appearance. She is well-developed and normal weight.  HENT:     Head: Normocephalic and atraumatic.     Ears:     Comments: Right TM has some yellow serous fluid, no erythema    Nose: Congestion present.     Comments: The right nasal membranes are swollen and erythematous, left normal    Mouth/Throat:     Pharynx: No posterior oropharyngeal erythema.  Eyes:     Conjunctiva/sclera: Conjunctivae normal.     Pupils: Pupils are equal, round, and reactive to light.  Cardiovascular:     Rate and Rhythm: Normal rate and regular rhythm.     Heart sounds: Normal heart sounds.  Pulmonary:     Effort: Pulmonary effort is normal. No respiratory distress.     Breath sounds: Normal breath sounds.     Comments: Lungs are clear.  Few central rhonchi Abdominal:     General: There is no distension.     Palpations: Abdomen is soft.  Musculoskeletal:        General: Normal range of motion.     Cervical back: Normal range of motion.  Skin:    General: Skin is warm and dry.  Neurological:     Mental Status: She is alert.  Psychiatric:        Mood and Affect: Mood normal.        Behavior: Behavior normal.      UC Treatments / Results  Labs (all labs ordered are listed, but only abnormal results are displayed) Labs Reviewed  CBC WITH DIFFERENTIAL/PLATELET  CBC is normal  EKG   Radiology DG Chest 2 View  Result Date: 09/03/2019 CLINICAL DATA:  Cough. Diagnosed with pneumonia 2 weeks ago. Still having cough and congestion after antibiotics. EXAM: CHEST - 2 VIEW COMPARISON:  08/22/2019 FINDINGS: The heart size is normal. Streaky opacities previously  noted in the  lung bases have improved. No consolidation or pleural effusion. No pulmonary edema. IMPRESSION: Improved aeration. Electronically Signed   By: Nolon Nations M.D.   On: 09/03/2019 11:02    Procedures Procedures (including critical care time)  Medications Ordered in UC Medications - No data to display  Initial Impression / Assessment and Plan / UC Course  I have reviewed the triage vital signs and the nursing notes.  Pertinent labs & imaging results that were available during my care of the patient were reviewed by me and considered in my medical decision making (see chart for details).     Explained the patient with a normal chest x-ray and normal CBC it is unlikely she has any persistent pneumonia.  I feel like she is perhaps a little immune compromised after her pneumonia and has caught now a viral sinus infection.  States she does not usually have allergies.  Will treat with Flonase, fluids, rest, return as needed Final Clinical Impressions(s) / UC Diagnoses   Final diagnoses:  Viral URI  Sinus congestion     Discharge Instructions     Add flonase 2 x a day for a week then once a day until well Eat well and try to get enough rest Drink lots of water Call your PCP if not improving in a week   ED Prescriptions    None     PDMP not reviewed this encounter.   Raylene Everts, MD 09/03/19 1144

## 2019-09-03 NOTE — ED Triage Notes (Signed)
Pt states she was dx w/pneumonia 2 wks ago. Pt c/o chest heaviness and upper back painx2 wks. Pt states the back pain started again last night. Pt states she's still congested.

## 2019-09-12 ENCOUNTER — Other Ambulatory Visit: Payer: Self-pay

## 2019-09-12 ENCOUNTER — Ambulatory Visit (INDEPENDENT_AMBULATORY_CARE_PROVIDER_SITE_OTHER): Payer: BC Managed Care – PPO | Admitting: Physician Assistant

## 2019-09-12 ENCOUNTER — Encounter: Payer: Self-pay | Admitting: Physician Assistant

## 2019-09-12 DIAGNOSIS — R21 Rash and other nonspecific skin eruption: Secondary | ICD-10-CM | POA: Diagnosis not present

## 2019-09-12 DIAGNOSIS — Z85828 Personal history of other malignant neoplasm of skin: Secondary | ICD-10-CM

## 2019-09-12 DIAGNOSIS — L280 Lichen simplex chronicus: Secondary | ICD-10-CM | POA: Diagnosis not present

## 2019-09-12 NOTE — Progress Notes (Signed)
   Follow-Up Visit   Subjective  Belinda Miller is a 59 y.o. female who presents for the following: Rash (finger and toes x 2 weeks - tx none). Patient was given Augmentin and Azithromycin for pneumona. Rash developed when antibiotics were completed. Palmar aspect of fingers and toes.   The following portions of the chart were reviewed this encounter and updated as appropriate: Tobacco  Allergies  Meds  Problems  Med Hx  Surg Hx  Fam Hx      Objective  Well appearing patient in no apparent distress; mood and affect are within normal limits.  A full examination was performed including scalp, head, eyes, ears, nose, lips, neck, chest, axillae, abdomen, back, buttocks, bilateral upper extremities, bilateral lower extremities, hands, feet, fingers, toes, fingernails, and toenails. All findings within normal limits unless otherwise noted below.  Objective  Neck - Posterior: Linear scar with small keloid   Objective  Left 2nd Finger Tip, Left Hand - Posterior, Left Palmar Proximal Thumb, Left Plantar Hallux Toe, Right 3rd Metacarpophalangeal Region, Right Tip of Hallux: Hard, dense nodules palmar and plantar aspect. No symptoms   Assessment & Plan  History of basal cell carcinoma (BCC) Neck - Posterior  Yearly skin exam  Rash and other nonspecific skin eruption (2) Left Plantar Hallux Toe; Right Tip of Hallux  Epidermal / dermal shaving - Left Plantar Hallux Toe  Lesion diameter (cm):  1 Informed consent: discussed and consent obtained   Timeout: patient name, date of birth, surgical site, and procedure verified   Procedure prep:  Patient was prepped and draped in usual sterile fashion Prep type:  Chlorhexidine Anesthesia: the lesion was anesthetized in a standard fashion   Anesthetic:  1% lidocaine w/ epinephrine 1-100,000 local infiltration Instrument used: DermaBlade   Hemostasis achieved with: aluminum chloride   Outcome: patient tolerated procedure well     Post-procedure details: sterile dressing applied and wound care instructions given   Dressing type: petrolatum gauze, petrolatum and bandage    Epidermal / dermal shaving - Right Tip of Hallux  Lesion diameter (cm):  1 Informed consent: discussed and consent obtained   Timeout: patient name, date of birth, surgical site, and procedure verified   Procedure prep:  Patient was prepped and draped in usual sterile fashion Prep type:  Chlorhexidine Anesthesia: the lesion was anesthetized in a standard fashion   Anesthetic:  1% lidocaine w/ epinephrine 1-100,000 local infiltration Instrument used: DermaBlade   Hemostasis achieved with: aluminum chloride   Outcome: patient tolerated procedure well   Post-procedure details: sterile dressing applied and wound care instructions given   Dressing type: petrolatum gauze, petrolatum and bandage    Specimen 1 - Surgical pathology Differential Diagnosis: drug eruption Check Margins: No Hard, dense nodules palmar and plantar aspect. No symptoms  Specimen 2 - Surgical pathology Differential Diagnosis:drug eruption Check Margins: No Hard, dense nodules palmar and plantar aspect. No symptoms

## 2019-09-12 NOTE — Patient Instructions (Signed)

## 2019-09-25 ENCOUNTER — Telehealth: Payer: Self-pay | Admitting: Physician Assistant

## 2019-09-26 MED ORDER — CLOBETASOL PROPIONATE 0.05 % EX OINT
1.0000 | TOPICAL_OINTMENT | Freq: Two times a day (BID) | CUTANEOUS | 2 refills | Status: AC
Start: 2019-09-26 — End: ?

## 2019-09-26 NOTE — Telephone Encounter (Signed)
Sent in the prescription for the clobetasol ointment 45 gm with 2 refills per kelli sheffield.

## 2019-09-26 NOTE — Telephone Encounter (Signed)
-----   Message from Warren Danes, [place] sent at 09/25/2019  1:48 PM EDT ----- I called the patient to give her results.  She will call her pcp to discontinue lisinopril. Please call in Clobetasol ointment or cream bid 45g 2 rf

## 2019-10-03 DIAGNOSIS — Z13220 Encounter for screening for lipoid disorders: Secondary | ICD-10-CM | POA: Diagnosis not present

## 2019-10-03 DIAGNOSIS — E039 Hypothyroidism, unspecified: Secondary | ICD-10-CM | POA: Diagnosis not present

## 2019-10-03 DIAGNOSIS — M85852 Other specified disorders of bone density and structure, left thigh: Secondary | ICD-10-CM | POA: Diagnosis not present

## 2019-10-09 DIAGNOSIS — I10 Essential (primary) hypertension: Secondary | ICD-10-CM | POA: Diagnosis not present

## 2019-10-09 DIAGNOSIS — E039 Hypothyroidism, unspecified: Secondary | ICD-10-CM | POA: Diagnosis not present

## 2019-10-09 DIAGNOSIS — E782 Mixed hyperlipidemia: Secondary | ICD-10-CM | POA: Diagnosis not present

## 2019-10-09 DIAGNOSIS — M2550 Pain in unspecified joint: Secondary | ICD-10-CM | POA: Diagnosis not present

## 2019-10-17 DIAGNOSIS — M2550 Pain in unspecified joint: Secondary | ICD-10-CM | POA: Diagnosis not present

## 2019-10-19 ENCOUNTER — Ambulatory Visit: Payer: BC Managed Care – PPO | Admitting: Physician Assistant

## 2019-10-25 DIAGNOSIS — M25474 Effusion, right foot: Secondary | ICD-10-CM | POA: Diagnosis not present

## 2019-10-25 DIAGNOSIS — M25475 Effusion, left foot: Secondary | ICD-10-CM | POA: Diagnosis not present

## 2019-10-25 DIAGNOSIS — M25471 Effusion, right ankle: Secondary | ICD-10-CM | POA: Diagnosis not present

## 2019-10-25 DIAGNOSIS — M25472 Effusion, left ankle: Secondary | ICD-10-CM | POA: Diagnosis not present

## 2019-11-09 ENCOUNTER — Other Ambulatory Visit: Payer: Self-pay

## 2019-11-09 ENCOUNTER — Ambulatory Visit (HOSPITAL_COMMUNITY)
Admission: EM | Admit: 2019-11-09 | Discharge: 2019-11-09 | Disposition: A | Discharge status: Home / Self Care | Payer: BC Managed Care – PPO | Attending: Internal Medicine | Admitting: Internal Medicine

## 2019-11-09 ENCOUNTER — Encounter (HOSPITAL_COMMUNITY): Payer: Self-pay | Admitting: *Deleted

## 2019-11-09 DIAGNOSIS — R21 Rash and other nonspecific skin eruption: Secondary | ICD-10-CM | POA: Diagnosis not present

## 2019-11-09 DIAGNOSIS — R5383 Other fatigue: Secondary | ICD-10-CM | POA: Diagnosis not present

## 2019-11-09 DIAGNOSIS — M2550 Pain in unspecified joint: Secondary | ICD-10-CM | POA: Diagnosis not present

## 2019-11-09 DIAGNOSIS — R0789 Other chest pain: Secondary | ICD-10-CM | POA: Diagnosis not present

## 2019-11-09 DIAGNOSIS — M1990 Unspecified osteoarthritis, unspecified site: Secondary | ICD-10-CM | POA: Diagnosis not present

## 2019-11-09 DIAGNOSIS — R1013 Epigastric pain: Secondary | ICD-10-CM | POA: Diagnosis not present

## 2019-11-09 MED ORDER — CYCLOBENZAPRINE HCL 10 MG PO TABS
10.0000 mg | ORAL_TABLET | Freq: Three times a day (TID) | ORAL | 0 refills | Status: DC | PRN
Start: 2019-11-09 — End: 2020-08-17

## 2019-11-09 NOTE — ED Triage Notes (Addendum)
Patient reports 2 day history of worsening midsternal chest pain and back pain. States that she completed a 12 day prednisone prescription from rheumatiologist for joint pain and swelling. States say rheumatologist today and was given prescription for sucralfate and another steroid injection.   Chest pain is associated with movement and breathing. Denies n/v/sob or dizziness.  Patient was concerned because it was her heart and didn't know if she needed further testing.   Also endorses headache since Tuesday that has worsened.

## 2019-11-09 NOTE — Discharge Instructions (Addendum)
Gentle stretching exercises Tylenol as needed for pain Take muscle relaxants at bedtime Use heating pad for 10 minutes on/15 minutes of for 4 cycles twice a day.

## 2019-11-10 NOTE — ED Provider Notes (Signed)
[place]    CSN: [id number] Arrival date & time: 11/09/19  1807      History   Chief Complaint Chief Complaint  Patient presents with  . Chest Pain    HPI CASARA PERRIER is a 59 y.o. female comes to the urgent care with complaints of chest pain as well as mid back pain.  Patient recently completed a course of prednisone for generalized body aches and joint swelling.  Following that patient says that she is developed sharp throbbing pain across the chest and also in her back.  Pain is aggravated by movement.  Pain is not radiating to the shoulder left arm.  No vomiting or diaphoresis.  No trauma or falls.  No cough, sputum production, fever or chills   HPI  Past Medical History:  Diagnosis Date  . Basal cell carcinoma 06/20/2019   left side neck(CX35FU)  . Hypertension   . Thyroid disease     Patient Active Problem List   Diagnosis Date Noted  . Essential hypertension 09/03/2019  . HLD (hyperlipidemia) 09/03/2019  . Osteopenia 09/03/2019  . Depression with anxiety 09/03/2019  . Hypothyroidism 09/03/2019    History reviewed. No pertinent surgical history.  OB History   No obstetric history on file.      Home Medications    Prior to Admission medications   Medication Sig Start Date End Date Taking? Authorizing Provider  sucralfate (CARAFATE) 1 g tablet Take 1 g by mouth 4 (four) times daily -  with meals and at bedtime.   Yes [provider]  calcium carbonate (OSCAL) 1500 (600 Ca) MG TABS tablet Take by mouth 2 (two) times daily with a meal.    [provider]  cholecalciferol (VITAMIN D3) 25 MCG (1000 UNIT) tablet Take 1,000 Units by mouth daily.    [provider]  clobetasol ointment (TEMOVATE) 3.00 % Apply 1 application topically 2 (two) times daily. 09/26/19   Sheffield, Ronalee Red, PA-C  cyclobenzaprine (FLEXERIL) 10 MG tablet Take 1 tablet (10 mg total) by mouth 3 (three) times daily as needed for muscle spasms. 11/09/19    LampteyMyrene Galas, MD  levothyroxine (SYNTHROID) 50 MCG tablet Take 50 mcg by mouth daily before breakfast.    [provider]  lisinopril (ZESTRIL) 10 MG tablet Take 10 mg by mouth daily.    [provider]  minocycline (MINOCIN) 100 MG capsule Take 1 capsule (100 mg total) by mouth in the morning and at bedtime. 06/20/19   Sheffield, Vida Roller R, PA-C  Omega-3 Fatty Acids (FISH OIL) 500 MG CAPS Take by mouth.    [provider]  PARoxetine (PAXIL) 20 MG tablet Take 20 mg by mouth daily.    [provider]  raloxifene (EVISTA) 60 MG tablet Take 60 mg by mouth daily.    [provider]  simvastatin (ZOCOR) 20 MG tablet Take 20 mg by mouth daily.    [provider]  triamcinolone cream (KENALOG) 0.1 % Apply 1 application topically 2 (two) times daily. 06/20/19   Warren Danes, PA-C    Family History Family History  Problem Relation Age of Onset  . Cancer Mother   . Cancer Father     Social History Social History   Tobacco Use  . Smoking status: Never Smoker  . Smokeless tobacco: Never Used  Vaping Use  . Vaping Use: Never used  Substance Use Topics  . Alcohol use: Yes    Alcohol/week: 1.0 standard drink  Types: 1 Glasses of wine per week  . Drug use: Never     Allergies   Patient has no known allergies.   Review of Systems Review of Systems  Constitutional: Negative.   Respiratory: Negative for cough.   Cardiovascular: Positive for chest pain. Negative for palpitations.  Gastrointestinal: Negative for abdominal pain.  Musculoskeletal: Positive for back pain.     Physical Exam Triage Vital Signs ED Triage Vitals  Enc Vitals Group     BP 11/09/19 1836 (!) 130/92     Pulse Rate 11/09/19 1836 (!) 110     Resp 11/09/19 1836 17     Temp 11/09/19 1836 99.4 F (37.4 C)     Temp Source 11/09/19 1836 Oral     SpO2 11/09/19 1836 99 %     Weight --      Height --      Head Circumference --      Peak Flow --       Pain Score 11/09/19 1838 6     Pain Loc --      Pain Edu? --      Excl. in [place]? --    No data found.  Updated Vital Signs BP (!) 130/92 (BP Location: Right Arm)   Pulse (!) 110   Temp 99.4 F (37.4 C) (Oral)   Resp 17   SpO2 99%   Visual Acuity Right Eye Distance:   Left Eye Distance:   Bilateral Distance:    Right Eye Near:   Left Eye Near:    Bilateral Near:     Physical Exam Vitals and nursing note reviewed.  Constitutional:      General: She is in acute distress.  Cardiovascular:     Heart sounds: Normal heart sounds.  Pulmonary:     Effort: Pulmonary effort is normal.  Chest:     Chest wall: Tenderness present. No mass.     Comments: Tenderness on palpation of several intercostal muscles mainly on theon the left ribcage. Neurological:     Mental Status: She is alert.      UC Treatments / Results  Labs (all labs ordered are listed, but only abnormal results are displayed) Labs Reviewed - No data to display  EKG   Radiology No results found.  Procedures Procedures (including critical care time)  Medications Ordered in UC Medications - No data to display  Initial Impression / Assessment and Plan / UC Course  I have reviewed the triage vital signs and the nursing notes.  Pertinent labs & imaging results that were available during my care of the patient were reviewed by me and considered in my medical decision making (see chart for details).     1.  Musculoskeletal chest wall pain: EKG showed normal sinus rhythm with no acute ST/T wave changes Flexeril as needed for muscle spasms in the back Tylenol as needed for pain. Patient is not hypoxic mildly tachycardic has resolved pain but not dyspneic.  Her Wells score is low probability for DVT/PE. Return precautions given. Final Clinical Impressions(s) / UC Diagnoses   Final diagnoses:  Musculoskeletal chest pain     Discharge Instructions     Gentle stretching exercises Tylenol as needed for  pain Take muscle relaxants at bedtime Use heating pad for 10 minutes on/15 minutes of for 4 cycles twice a day.   ED Prescriptions    Medication Sig Dispense Auth. Provider   cyclobenzaprine (FLEXERIL) 10 MG tablet Take 1 tablet (10 mg total) by  mouth 3 (three) times daily as needed for muscle spasms. 20 tablet Odette Watanabe, Myrene Galas, MD     I have reviewed the PDMP during this encounter.   Chase Picket, MD 11/11/19 [phone]

## 2019-12-07 ENCOUNTER — Other Ambulatory Visit: Payer: Self-pay | Admitting: Family Medicine

## 2019-12-07 DIAGNOSIS — Z0000 Encounter for general adult medical examination without abnormal findings: Secondary | ICD-10-CM

## 2019-12-19 ENCOUNTER — Other Ambulatory Visit: Payer: Self-pay

## 2019-12-19 ENCOUNTER — Ambulatory Visit
Admission: RE | Admit: 2019-12-19 | Discharge: 2019-12-19 | Disposition: A | Discharge status: Home / Self Care | Payer: BC Managed Care – PPO | Source: Ambulatory Visit | Attending: Family Medicine | Admitting: Family Medicine

## 2019-12-19 DIAGNOSIS — Z0000 Encounter for general adult medical examination without abnormal findings: Secondary | ICD-10-CM

## 2019-12-19 DIAGNOSIS — Z1231 Encounter for screening mammogram for malignant neoplasm of breast: Secondary | ICD-10-CM | POA: Diagnosis not present

## 2019-12-26 ENCOUNTER — Other Ambulatory Visit: Payer: Self-pay | Admitting: Physician Assistant

## 2019-12-26 DIAGNOSIS — L710 Perioral dermatitis: Secondary | ICD-10-CM

## 2020-03-14 DIAGNOSIS — Z23 Encounter for immunization: Secondary | ICD-10-CM | POA: Diagnosis not present

## 2020-04-03 DIAGNOSIS — E039 Hypothyroidism, unspecified: Secondary | ICD-10-CM | POA: Diagnosis not present

## 2020-04-09 DIAGNOSIS — F3342 Major depressive disorder, recurrent, in full remission: Secondary | ICD-10-CM | POA: Diagnosis not present

## 2020-04-09 DIAGNOSIS — I10 Essential (primary) hypertension: Secondary | ICD-10-CM | POA: Diagnosis not present

## 2020-04-09 DIAGNOSIS — Z124 Encounter for screening for malignant neoplasm of cervix: Secondary | ICD-10-CM | POA: Diagnosis not present

## 2020-04-09 DIAGNOSIS — Z23 Encounter for immunization: Secondary | ICD-10-CM | POA: Diagnosis not present

## 2020-04-09 DIAGNOSIS — M85852 Other specified disorders of bone density and structure, left thigh: Secondary | ICD-10-CM | POA: Diagnosis not present

## 2020-04-09 DIAGNOSIS — Z0000 Encounter for general adult medical examination without abnormal findings: Secondary | ICD-10-CM | POA: Diagnosis not present

## 2020-04-13 ENCOUNTER — Other Ambulatory Visit: Payer: Self-pay | Admitting: Family Medicine

## 2020-04-13 DIAGNOSIS — M8580 Other specified disorders of bone density and structure, unspecified site: Secondary | ICD-10-CM

## 2020-04-13 DIAGNOSIS — Z1231 Encounter for screening mammogram for malignant neoplasm of breast: Secondary | ICD-10-CM

## 2020-07-25 DIAGNOSIS — Z23 Encounter for immunization: Secondary | ICD-10-CM | POA: Diagnosis not present

## 2020-08-17 ENCOUNTER — Other Ambulatory Visit: Payer: Self-pay

## 2020-08-17 ENCOUNTER — Ambulatory Visit (HOSPITAL_COMMUNITY)
Admission: EM | Admit: 2020-08-17 | Discharge: 2020-08-17 | Disposition: A | Discharge status: Home / Self Care | Payer: BC Managed Care – PPO | Attending: Internal Medicine | Admitting: Internal Medicine

## 2020-08-17 ENCOUNTER — Ambulatory Visit (INDEPENDENT_AMBULATORY_CARE_PROVIDER_SITE_OTHER): Payer: BC Managed Care – PPO

## 2020-08-17 ENCOUNTER — Encounter (HOSPITAL_COMMUNITY): Payer: Self-pay | Admitting: Emergency Medicine

## 2020-08-17 DIAGNOSIS — S8012XA Contusion of left lower leg, initial encounter: Secondary | ICD-10-CM | POA: Diagnosis not present

## 2020-08-17 DIAGNOSIS — W57XXXA Bitten or stung by nonvenomous insect and other nonvenomous arthropods, initial encounter: Secondary | ICD-10-CM

## 2020-08-17 DIAGNOSIS — M79642 Pain in left hand: Secondary | ICD-10-CM

## 2020-08-17 DIAGNOSIS — W108XXA Fall (on) (from) other stairs and steps, initial encounter: Secondary | ICD-10-CM

## 2020-08-17 DIAGNOSIS — M7989 Other specified soft tissue disorders: Secondary | ICD-10-CM | POA: Diagnosis not present

## 2020-08-17 DIAGNOSIS — S62339A Displaced fracture of neck of unspecified metacarpal bone, initial encounter for closed fracture: Secondary | ICD-10-CM

## 2020-08-17 DIAGNOSIS — S80861A Insect bite (nonvenomous), right lower leg, initial encounter: Secondary | ICD-10-CM

## 2020-08-17 DIAGNOSIS — S90511A Abrasion, right ankle, initial encounter: Secondary | ICD-10-CM

## 2020-08-17 DIAGNOSIS — S62307A Unspecified fracture of fifth metacarpal bone, left hand, initial encounter for closed fracture: Secondary | ICD-10-CM | POA: Diagnosis not present

## 2020-08-17 MED ORDER — DOXYCYCLINE HYCLATE 100 MG PO CAPS
100.0000 mg | ORAL_CAPSULE | Freq: Two times a day (BID) | ORAL | 0 refills | Status: DC
Start: 2020-08-17 — End: 2020-08-24

## 2020-08-17 MED ORDER — HYDROCODONE-ACETAMINOPHEN 5-325 MG PO TABS: 1.0000 | ORAL_TABLET | Freq: Four times a day (QID) | ORAL | 0 refills | Status: DC | PRN

## 2020-08-17 NOTE — ED Triage Notes (Signed)
Patient was walking up 2 concrete steps at her home.  Patient had her hands full ( pocketbook and phone) and patient wearing flip flops.  Patient has multiple bruises to extremities.  left hand is swollen and painful.  Left little and ring finger are particularly painful, including palm of left hand.  Patient has a large hematoma to left lower leg, bruising to right forearm, anterior

## 2020-08-17 NOTE — ED Provider Notes (Signed)
[facility]    CSN: [id number] Arrival date & time: 08/17/20  1719      History   Chief Complaint Chief Complaint  Patient presents with  . Fall    HPI Belinda Miller is a 60 y.o. female presents with L hand L shin and R medial ankle pain from falling on her steps of stone at home. She was wearing flip flops and does not know really what made her trip. Denies hitting her head or having neck or back pain.   2- she got bit by  A tick yesterday behind her R leg. Wants to have it checked.   Past Medical History:  Diagnosis Date  . Basal cell carcinoma 06/20/2019   left side neck(CX35FU)  . Hypertension   . Thyroid disease     Patient Active Problem List   Diagnosis Date Noted  . Essential hypertension 09/03/2019  . HLD (hyperlipidemia) 09/03/2019  . Osteopenia 09/03/2019  . Depression with anxiety 09/03/2019  . Hypothyroidism 09/03/2019    History reviewed. No pertinent surgical history.  OB History   No obstetric history on file.      Home Medications    Prior to Admission medications   Medication Sig Start Date End Date Taking? Authorizing Provider  calcium carbonate (OSCAL) 1500 (600 Ca) MG TABS tablet Take by mouth 2 (two) times daily with a meal.   Yes [provider]  doxycycline (VIBRAMYCIN) 100 MG capsule Take 1 capsule (100 mg total) by mouth 2 (two) times daily. 08/17/20  Yes Rodriguez-Southworth, Sunday Spillers, PA-C  levothyroxine (SYNTHROID) 50 MCG tablet Take 50 mcg by mouth daily before breakfast.   Yes [provider]  lisinopril (ZESTRIL) 10 MG tablet Take 10 mg by mouth daily.   Yes [provider]  Omega-3 Fatty Acids (FISH OIL) 500 MG CAPS Take by mouth.   Yes [provider]  PARoxetine (PAXIL) 20 MG tablet Take 20 mg by mouth daily.   Yes [provider]  raloxifene (EVISTA) 60 MG tablet Take 60 mg by mouth daily.   Yes [provider]  simvastatin (ZOCOR) 20 MG tablet Take 20 mg by mouth  daily.   Yes [provider]  cholecalciferol (VITAMIN D3) 25 MCG (1000 UNIT) tablet Take 1,000 Units by mouth daily.    [provider]  clobetasol ointment (TEMOVATE) 9.03 % Apply 1 application topically 2 (two) times daily. 09/26/19   Sheffield, Ronalee Red, PA-C  HYDROcodone-acetaminophen (NORCO/VICODIN) 5-325 MG tablet Take 1 tablet by mouth every 6 (six) hours as needed. 08/17/20   Rodriguez-Southworth, Sunday Spillers, PA-C  triamcinolone cream (KENALOG) 0.1 % Apply 1 application topically 2 (two) times daily. 06/20/19   Sheffield, Vida Roller R, PA-C  sucralfate (CARAFATE) 1 g tablet Take 1 g by mouth 4 (four) times daily -  with meals and at bedtime. Patient not taking: Reported on 08/17/2020  08/17/20  [provider]    Family History Family History  Problem Relation Age of Onset  . Cancer Mother   . Cancer Father     Social History Social History   Tobacco Use  . Smoking status: Never Smoker  . Smokeless tobacco: Never Used  Vaping Use  . Vaping Use: Never used  Substance Use Topics  . Alcohol use: Yes    Alcohol/week: 1.0 standard drink    Types: 1 Glasses of wine per week  . Drug use: Never     Allergies   Patient has no known allergies.   Review  of Systems Review of Systems  Musculoskeletal: Positive for arthralgias. Negative for back pain, gait problem and neck pain.  Skin: Positive for color change and wound. Negative for pallor.  Neurological: Negative for numbness.     Physical Exam Triage Vital Signs ED Triage Vitals  Enc Vitals Group     BP 08/17/20 1753 128/85     Pulse Rate 08/17/20 1753 97     Resp 08/17/20 1753 18     Temp 08/17/20 1753 99 F (37.2 C)     Temp Source 08/17/20 1753 Oral     SpO2 08/17/20 1753 100 %     Weight --      Height --      Head Circumference --      Peak Flow --      Pain Score 08/17/20 1750 7     Pain Loc --      Pain Edu? --      Excl. in [place]? --    No data found.  Updated Vital Signs BP 128/85  (BP Location: Left Arm)   Pulse 97   Temp 99 F (37.2 C) (Oral)   Resp 18   SpO2 100%   Visual Acuity Right Eye Distance:   Left Eye Distance:   Bilateral Distance:    Right Eye Near:   Left Eye Near:    Bilateral Near:     Physical Exam Vitals and nursing note reviewed.  Constitutional:      General: She is not in acute distress.    Appearance: She is normal weight. She is not toxic-appearing.  HENT:     Head: Normocephalic.     Right Ear: External ear normal.     Left Ear: External ear normal.  Eyes:     General: No scleral icterus.    Conjunctiva/sclera: Conjunctivae normal.  Pulmonary:     Effort: Pulmonary effort is normal.  Musculoskeletal:     Cervical back: Neck supple. No tenderness.     Comments: L HAND- with mild ecchymosis, moderate swelling ober 4th and 5th knuckle. Her 5th knuckle is very tender. She has trouble making a fist due to pain. Pulses are intact.  L WRIST- normal  L LOWER LEG- with large hematoma and swelling on lateral mid region. Mid tibia is a little tender. ROM is normal, no pain provoked with foot movements.  R ANKLE- with superficial abrasion on medial region below malleolus. No bone tenderness.   Skin:    General: Skin is warm and dry.     Findings: Bruising present.     Comments: Bite site behind the R posterior knee with no sign of head noted. Has mild erythema and warmth around. Does no look like a target.   Neurological:     Mental Status: She is alert and oriented to person, place, and time.     Gait: Gait normal.  Psychiatric:        Mood and Affect: Mood normal.        Behavior: Behavior normal.        Thought Content: Thought content normal.        Judgment: Judgment normal.      UC Treatments / Results  Labs (all labs ordered are listed, but only abnormal results are displayed) Labs Reviewed - No data to display  EKG   Radiology DG Tibia/Fibula Left  Result Date: 08/17/2020 CLINICAL DATA:  Left leg bruising.  EXAM: LEFT TIBIA AND FIBULA - 2 VIEW COMPARISON:  None.  FINDINGS: There is no evidence of fracture or other focal bone lesions. Soft tissues are unremarkable. IMPRESSION: Negative. Electronically Signed   By: Marijo Conception M.D.   On: 08/17/2020 18:46   DG Hand Complete Left  Result Date: 08/17/2020 CLINICAL DATA:  Fall with left hand swelling and ecchymosis EXAM: LEFT HAND - COMPLETE 3+ VIEW COMPARISON:  None. FINDINGS: Acute obliquely oriented fracture of the distal fifth metacarpal metaphysis with mild dorsal apex angulation. No definite intra-articular extension to the fifth MCP joint, although the fracture line closely approximates the articular surface of the fifth metacarpal head. No dislocation. No additional fractures. No significant arthropathy. There is mild soft tissue swelling overlying the fracture site. IMPRESSION: Acute mildly angulated fracture of the distal fifth metacarpal metaphysis. Electronically Signed   By: Davina Poke D.O.   On: 08/17/2020 18:49    Procedures Procedures (including critical care time)  Medications Ordered in UC Medications - No data to display  Initial Impression / Assessment and Plan / UC Course  I have reviewed the triage vital signs and the nursing notes. Pertinent  imaging results that were available during my care of the patient were reviewed by me and considered in my medical decision making (see chart for details). Ulna gutter splint applied by ortho tech. She was given Doxy for the tick bite and Norco for pain.  Final Clinical Impressions(s) / UC Diagnoses   Final diagnoses:  Closed boxer's fracture, initial encounter  Hematoma of left lower leg  Ankle abrasion, right, initial encounter  Tick bite of right lower leg, initial encounter     Discharge Instructions     Follow up with orthopedics on Monday.     ED Prescriptions    Medication Sig Dispense Auth. Provider   HYDROcodone-acetaminophen (NORCO/VICODIN) 5-325 MG tablet   (Status: Discontinued) Take 1 tablet by mouth every 6 (six) hours as needed. 10 tablet Rodriguez-Southworth, Sunday Spillers, PA-C   HYDROcodone-acetaminophen (NORCO/VICODIN) 5-325 MG tablet  (Status: Discontinued) Take 1 tablet by mouth every 6 (six) hours as needed. 10 tablet Rodriguez-Southworth, Sunday Spillers, PA-C   HYDROcodone-acetaminophen (NORCO/VICODIN) 5-325 MG tablet Take 1 tablet by mouth every 6 (six) hours as needed. 10 tablet Rodriguez-Southworth, Sunday Spillers, PA-C   doxycycline (VIBRAMYCIN) 100 MG capsule Take 1 capsule (100 mg total) by mouth 2 (two) times daily. 20 capsule Rodriguez-Southworth, Sunday Spillers, [place]     I have reviewed the PDMP during this encounter.   Shelby Mattocks, [place] 08/17/20 1915

## 2020-08-17 NOTE — Discharge Instructions (Signed)
Follow up with orthopedics on Monday.

## 2020-08-17 NOTE — Progress Notes (Signed)
Orthopedic Tech Progress Note Patient Details:  Belinda Miller 02-10-1961 [id number]  Ortho Devices Type of Ortho Device: Ulna gutter splint Ortho Device/Splint Location: lue Ortho Device/Splint Interventions: Application,Adjustment,Ordered   Post Interventions Patient Tolerated: Well Instructions Provided: Care of device,Adjustment of device   [name] 08/17/2020, 8:37 PM

## 2020-08-19 DIAGNOSIS — S62337A Displaced fracture of neck of fifth metacarpal bone, left hand, initial encounter for closed fracture: Secondary | ICD-10-CM | POA: Diagnosis not present

## 2020-08-24 ENCOUNTER — Encounter (HOSPITAL_COMMUNITY): Payer: Self-pay

## 2020-08-24 ENCOUNTER — Ambulatory Visit (HOSPITAL_COMMUNITY)
Admission: EM | Admit: 2020-08-24 | Discharge: 2020-08-24 | Disposition: A | Discharge status: Home / Self Care | Payer: BC Managed Care – PPO | Attending: Medical Oncology | Admitting: Medical Oncology

## 2020-08-24 DIAGNOSIS — M79605 Pain in left leg: Secondary | ICD-10-CM

## 2020-08-24 MED ORDER — DOXYCYCLINE HYCLATE 100 MG PO CAPS
100.0000 mg | ORAL_CAPSULE | Freq: Two times a day (BID) | ORAL | 0 refills | Status: DC
Start: 2020-08-24 — End: 2022-04-08

## 2020-08-24 NOTE — ED Triage Notes (Signed)
Pt c/o left leg pain, swelling and bruising. Pt states it feels hot to the touch.

## 2020-08-24 NOTE — ED Provider Notes (Signed)
Belinda Miller    CSN: [id number] Arrival date & time: 08/24/20  1333      History   Chief Complaint Chief Complaint  Patient presents with  . Leg Pain    left    HPI Belinda Miller is a 60 y.o. female.   HPI   Leg Pain: Patient states that last Saturday she sustained a fall onto concrete porch and steps.  She was seen in office following the fall as she had left leg pain and swelling as well as left hand and finger pain.  She was found to have fracture of one of her fingers but her leg was not found to be fractured.  She states that she initially had significant swelling of her left lower leg however this quickly resolved over the next few days.  Around Monday or Tuesday of this week she noticed bruising of the left leg.  Since yesterday she has also noticed some increased tenderness in the area where she fell.  The area is achy in nature of the anterior portion of the mid shin. No evidence of skin breakdown or bleeding. She has been using ice which has helped some. No numbness, tingling, pallor   Past Medical History:  Diagnosis Date  . Basal cell carcinoma 06/20/2019   left side neck(CX35FU)  . Hypertension   . Thyroid disease     Patient Active Problem List   Diagnosis Date Noted  . Essential hypertension 09/03/2019  . HLD (hyperlipidemia) 09/03/2019  . Osteopenia 09/03/2019  . Depression with anxiety 09/03/2019  . Hypothyroidism 09/03/2019    History reviewed. No pertinent surgical history.  OB History   No obstetric history on file.      Home Medications    Prior to Admission medications   Medication Sig Start Date End Date Taking? Authorizing Provider  calcium carbonate (OSCAL) 1500 (600 Ca) MG TABS tablet Take by mouth 2 (two) times daily with a meal.    [provider]  cholecalciferol (VITAMIN D3) 25 MCG (1000 UNIT) tablet Take 1,000 Units by mouth daily.    [provider]  clobetasol ointment (TEMOVATE) 1.82 % Apply 1  application topically 2 (two) times daily. 09/26/19   Sheffield, Ronalee Red, PA-C  doxycycline (VIBRAMYCIN) 100 MG capsule Take 1 capsule (100 mg total) by mouth 2 (two) times daily. 08/17/20   Rodriguez-Southworth, Sunday Spillers, PA-C  HYDROcodone-acetaminophen (NORCO/VICODIN) 5-325 MG tablet Take 1 tablet by mouth every 6 (six) hours as needed. 08/17/20   Rodriguez-Southworth, Sunday Spillers, PA-C  levothyroxine (SYNTHROID) 50 MCG tablet Take 50 mcg by mouth daily before breakfast.    [provider]  lisinopril (ZESTRIL) 10 MG tablet Take 10 mg by mouth daily.    [provider]  Omega-3 Fatty Acids (FISH OIL) 500 MG CAPS Take by mouth.    [provider]  PARoxetine (PAXIL) 20 MG tablet Take 20 mg by mouth daily.    [provider]  raloxifene (EVISTA) 60 MG tablet Take 60 mg by mouth daily.    [provider]  simvastatin (ZOCOR) 20 MG tablet Take 20 mg by mouth daily.    [provider]  triamcinolone cream (KENALOG) 0.1 % Apply 1 application topically 2 (two) times daily. 06/20/19   Sheffield, Vida Roller R, PA-C  sucralfate (CARAFATE) 1 g tablet Take 1 g by mouth 4 (four) times daily -  with meals and at bedtime. Patient not taking: Reported on 08/17/2020  08/17/20  [provider]    Family  History Family History  Problem Relation Age of Onset  . Cancer Mother   . Cancer Father     Social History Social History   Tobacco Use  . Smoking status: Never Smoker  . Smokeless tobacco: Never Used  Vaping Use  . Vaping Use: Never used  Substance Use Topics  . Alcohol use: Yes    Alcohol/week: 1.0 standard drink    Types: 1 Glasses of wine per week  . Drug use: Never     Allergies   Patient has no known allergies.   Review of Systems Review of Systems  As stated above in HPI Physical Exam Triage Vital Signs ED Triage Vitals  Enc Vitals Group     BP 08/24/20 1443 117/70     Pulse Rate 08/24/20 1441 74     Resp 08/24/20 1441 20      Temp 08/24/20 1441 98.8 F (37.1 C)     Temp Source 08/24/20 1441 Oral     SpO2 08/24/20 1441 100 %     Weight --      Height --      Head Circumference --      Peak Flow --      Pain Score 08/24/20 1440 6     Pain Loc --      Pain Edu? --      Excl. in [place]? --    No data found.  Updated Vital Signs BP 117/70   Pulse 74   Temp 98.8 F (37.1 C) (Oral)   Resp 20   SpO2 100%   Physical Exam Vitals and nursing note reviewed.  Constitutional:      Appearance: Normal appearance.  HENT:     Head: Normocephalic and atraumatic.  Musculoskeletal:        General: Normal range of motion.     Right lower leg: No edema.     Left lower leg: No edema.     Comments: Negative posterior tenderness without effusion. No tenderness or effusion of the knees  Skin:    Capillary Refill: Capillary refill takes less than 2 seconds.     Comments: Left leg has scattered ecchymosis throughout which appears to be resolving. There is a 3x2 inch hematoma of the left anterior shin which is tender to palpation with mild erythema.   Neurological:     Mental Status: She is alert.      UC Treatments / Results  Labs (all labs ordered are listed, but only abnormal results are displayed) Labs Reviewed - No data to display  EKG   Radiology No results found.  Procedures Procedures (including critical care time)  Medications Ordered in UC Medications - No data to display  Initial Impression / Assessment and Plan / UC Course  I have reviewed the triage vital signs and the nursing notes.  Pertinent labs & imaging results that were available during my care of the patient were reviewed by me and considered in my medical decision making (see chart for details).    New.  I discussed with patient that it appears that she has a hematoma and that there may be a possibility of a slow vascular leak which should self resolve.  I have recommended gentle compression with an Ace wrap for the next few days as  well as elevation and heat to help with hematoma resolution.  I am going to send an antibiotic in case there erythema continues as this could represent some mild vasculitis.  There does not  appear to be an risk for DVT as her pain is anterior nature and she does not have any posterior tenderness or effusion.  Final Clinical Impressions(s) / UC Diagnoses   Final diagnoses:  None   Discharge Instructions   None    ED Prescriptions    None     PDMP not reviewed this encounter.   Hughie Closs, [place] 08/24/20 1537

## 2020-08-29 DIAGNOSIS — S62337A Displaced fracture of neck of fifth metacarpal bone, left hand, initial encounter for closed fracture: Secondary | ICD-10-CM | POA: Diagnosis not present

## 2020-08-29 DIAGNOSIS — M79642 Pain in left hand: Secondary | ICD-10-CM | POA: Diagnosis not present

## 2020-08-29 DIAGNOSIS — M79662 Pain in left lower leg: Secondary | ICD-10-CM | POA: Diagnosis not present

## 2020-09-13 DIAGNOSIS — S62337A Displaced fracture of neck of fifth metacarpal bone, left hand, initial encounter for closed fracture: Secondary | ICD-10-CM | POA: Diagnosis not present

## 2020-09-13 DIAGNOSIS — M25642 Stiffness of left hand, not elsewhere classified: Secondary | ICD-10-CM | POA: Diagnosis not present

## 2020-09-27 DIAGNOSIS — E039 Hypothyroidism, unspecified: Secondary | ICD-10-CM | POA: Diagnosis not present

## 2020-09-27 DIAGNOSIS — I10 Essential (primary) hypertension: Secondary | ICD-10-CM | POA: Diagnosis not present

## 2020-09-27 DIAGNOSIS — M85852 Other specified disorders of bone density and structure, left thigh: Secondary | ICD-10-CM | POA: Diagnosis not present

## 2020-09-27 DIAGNOSIS — E782 Mixed hyperlipidemia: Secondary | ICD-10-CM | POA: Diagnosis not present

## 2020-09-27 DIAGNOSIS — E559 Vitamin D deficiency, unspecified: Secondary | ICD-10-CM | POA: Diagnosis not present

## 2020-10-01 DIAGNOSIS — U071 COVID-19: Secondary | ICD-10-CM | POA: Diagnosis not present

## 2020-10-07 DIAGNOSIS — F3342 Major depressive disorder, recurrent, in full remission: Secondary | ICD-10-CM | POA: Diagnosis not present

## 2020-10-07 DIAGNOSIS — I10 Essential (primary) hypertension: Secondary | ICD-10-CM | POA: Diagnosis not present

## 2020-10-07 DIAGNOSIS — E782 Mixed hyperlipidemia: Secondary | ICD-10-CM | POA: Diagnosis not present

## 2020-10-07 DIAGNOSIS — E559 Vitamin D deficiency, unspecified: Secondary | ICD-10-CM | POA: Diagnosis not present

## 2020-10-11 DIAGNOSIS — S62337A Displaced fracture of neck of fifth metacarpal bone, left hand, initial encounter for closed fracture: Secondary | ICD-10-CM | POA: Diagnosis not present

## 2020-10-11 DIAGNOSIS — M25642 Stiffness of left hand, not elsewhere classified: Secondary | ICD-10-CM | POA: Diagnosis not present

## 2020-12-09 DIAGNOSIS — E782 Mixed hyperlipidemia: Secondary | ICD-10-CM | POA: Diagnosis not present

## 2020-12-30 ENCOUNTER — Ambulatory Visit
Admission: RE | Admit: 2020-12-30 | Discharge: 2020-12-30 | Disposition: A | Discharge status: Home / Self Care | Payer: BC Managed Care – PPO | Source: Ambulatory Visit | Attending: Family Medicine | Admitting: Family Medicine

## 2020-12-30 ENCOUNTER — Other Ambulatory Visit: Payer: Self-pay

## 2020-12-30 DIAGNOSIS — Z780 Asymptomatic menopausal state: Secondary | ICD-10-CM | POA: Diagnosis not present

## 2020-12-30 DIAGNOSIS — M8580 Other specified disorders of bone density and structure, unspecified site: Secondary | ICD-10-CM

## 2020-12-30 DIAGNOSIS — Z1231 Encounter for screening mammogram for malignant neoplasm of breast: Secondary | ICD-10-CM | POA: Diagnosis not present

## 2020-12-30 DIAGNOSIS — M8589 Other specified disorders of bone density and structure, multiple sites: Secondary | ICD-10-CM | POA: Diagnosis not present

## 2021-01-06 ENCOUNTER — Other Ambulatory Visit: Payer: Self-pay | Admitting: Family Medicine

## 2021-01-06 DIAGNOSIS — R928 Other abnormal and inconclusive findings on diagnostic imaging of breast: Secondary | ICD-10-CM

## 2021-01-17 DIAGNOSIS — H2513 Age-related nuclear cataract, bilateral: Secondary | ICD-10-CM | POA: Diagnosis not present

## 2021-01-21 ENCOUNTER — Ambulatory Visit
Admission: RE | Admit: 2021-01-21 | Discharge: 2021-01-21 | Disposition: A | Discharge status: Home / Self Care | Payer: BC Managed Care – PPO | Source: Ambulatory Visit | Attending: Family Medicine | Admitting: Family Medicine

## 2021-01-21 ENCOUNTER — Other Ambulatory Visit: Payer: Self-pay | Admitting: Family Medicine

## 2021-01-21 ENCOUNTER — Other Ambulatory Visit: Payer: Self-pay

## 2021-01-21 DIAGNOSIS — R928 Other abnormal and inconclusive findings on diagnostic imaging of breast: Secondary | ICD-10-CM

## 2021-01-21 DIAGNOSIS — R922 Inconclusive mammogram: Secondary | ICD-10-CM | POA: Diagnosis not present

## 2021-01-29 ENCOUNTER — Other Ambulatory Visit: Payer: Self-pay

## 2021-01-29 ENCOUNTER — Ambulatory Visit
Admission: RE | Admit: 2021-01-29 | Discharge: 2021-01-29 | Disposition: A | Discharge status: Home / Self Care | Payer: BC Managed Care – PPO | Source: Ambulatory Visit | Attending: Family Medicine | Admitting: Family Medicine

## 2021-01-29 ENCOUNTER — Other Ambulatory Visit: Payer: Self-pay | Admitting: Family Medicine

## 2021-01-29 DIAGNOSIS — R928 Other abnormal and inconclusive findings on diagnostic imaging of breast: Secondary | ICD-10-CM

## 2021-04-07 DIAGNOSIS — E782 Mixed hyperlipidemia: Secondary | ICD-10-CM | POA: Diagnosis not present

## 2021-04-07 DIAGNOSIS — E039 Hypothyroidism, unspecified: Secondary | ICD-10-CM | POA: Diagnosis not present

## 2021-04-11 DIAGNOSIS — F3342 Major depressive disorder, recurrent, in full remission: Secondary | ICD-10-CM | POA: Diagnosis not present

## 2021-04-11 DIAGNOSIS — Z0000 Encounter for general adult medical examination without abnormal findings: Secondary | ICD-10-CM | POA: Diagnosis not present

## 2021-04-11 DIAGNOSIS — E782 Mixed hyperlipidemia: Secondary | ICD-10-CM | POA: Diagnosis not present

## 2021-04-11 DIAGNOSIS — I10 Essential (primary) hypertension: Secondary | ICD-10-CM | POA: Diagnosis not present

## 2021-04-11 DIAGNOSIS — E039 Hypothyroidism, unspecified: Secondary | ICD-10-CM | POA: Diagnosis not present

## 2021-05-03 DIAGNOSIS — J069 Acute upper respiratory infection, unspecified: Secondary | ICD-10-CM | POA: Diagnosis not present

## 2021-05-06 DIAGNOSIS — D125 Benign neoplasm of sigmoid colon: Secondary | ICD-10-CM | POA: Diagnosis not present

## 2021-05-06 DIAGNOSIS — D123 Benign neoplasm of transverse colon: Secondary | ICD-10-CM | POA: Diagnosis not present

## 2021-05-06 DIAGNOSIS — K6289 Other specified diseases of anus and rectum: Secondary | ICD-10-CM | POA: Diagnosis not present

## 2021-05-06 DIAGNOSIS — Z1211 Encounter for screening for malignant neoplasm of colon: Secondary | ICD-10-CM | POA: Diagnosis not present

## 2021-07-30 ENCOUNTER — Ambulatory Visit
Admission: RE | Admit: 2021-07-30 | Discharge: 2021-07-30 | Disposition: A | Discharge status: Home / Self Care | Payer: BC Managed Care – PPO | Source: Ambulatory Visit | Attending: Family Medicine | Admitting: Family Medicine

## 2021-07-30 ENCOUNTER — Other Ambulatory Visit: Payer: Self-pay | Admitting: Family Medicine

## 2021-07-30 DIAGNOSIS — N6489 Other specified disorders of breast: Secondary | ICD-10-CM

## 2021-07-30 DIAGNOSIS — R928 Other abnormal and inconclusive findings on diagnostic imaging of breast: Secondary | ICD-10-CM

## 2021-10-08 DIAGNOSIS — L304 Erythema intertrigo: Secondary | ICD-10-CM | POA: Diagnosis not present

## 2021-10-08 DIAGNOSIS — L309 Dermatitis, unspecified: Secondary | ICD-10-CM | POA: Diagnosis not present

## 2021-10-08 DIAGNOSIS — L570 Actinic keratosis: Secondary | ICD-10-CM | POA: Diagnosis not present

## 2021-10-24 DIAGNOSIS — E039 Hypothyroidism, unspecified: Secondary | ICD-10-CM | POA: Diagnosis not present

## 2021-10-24 DIAGNOSIS — R5383 Other fatigue: Secondary | ICD-10-CM | POA: Diagnosis not present

## 2021-10-24 DIAGNOSIS — E782 Mixed hyperlipidemia: Secondary | ICD-10-CM | POA: Diagnosis not present

## 2021-10-24 DIAGNOSIS — I10 Essential (primary) hypertension: Secondary | ICD-10-CM | POA: Diagnosis not present

## 2022-01-21 DIAGNOSIS — H2513 Age-related nuclear cataract, bilateral: Secondary | ICD-10-CM | POA: Diagnosis not present

## 2022-01-22 ENCOUNTER — Other Ambulatory Visit: Payer: Self-pay | Admitting: Family Medicine

## 2022-01-22 ENCOUNTER — Ambulatory Visit
Admission: RE | Admit: 2022-01-22 | Discharge: 2022-01-22 | Disposition: A | Discharge status: Home / Self Care | Payer: BC Managed Care – PPO | Source: Ambulatory Visit | Attending: Family Medicine | Admitting: Family Medicine

## 2022-01-22 DIAGNOSIS — N6489 Other specified disorders of breast: Secondary | ICD-10-CM

## 2022-01-22 DIAGNOSIS — R92323 Mammographic fibroglandular density, bilateral breasts: Secondary | ICD-10-CM | POA: Diagnosis not present

## 2022-02-03 ENCOUNTER — Ambulatory Visit
Admission: RE | Admit: 2022-02-03 | Discharge: 2022-02-03 | Disposition: A | Discharge status: Home / Self Care | Payer: BC Managed Care – PPO | Source: Ambulatory Visit | Attending: Family Medicine | Admitting: Family Medicine

## 2022-02-03 DIAGNOSIS — N6489 Other specified disorders of breast: Secondary | ICD-10-CM

## 2022-02-03 DIAGNOSIS — D242 Benign neoplasm of left breast: Secondary | ICD-10-CM | POA: Diagnosis not present

## 2022-02-03 DIAGNOSIS — N6323 Unspecified lump in the left breast, lower outer quadrant: Secondary | ICD-10-CM | POA: Diagnosis not present

## 2022-02-03 DIAGNOSIS — N6012 Diffuse cystic mastopathy of left breast: Secondary | ICD-10-CM | POA: Diagnosis not present

## 2022-02-03 DIAGNOSIS — R921 Mammographic calcification found on diagnostic imaging of breast: Secondary | ICD-10-CM | POA: Diagnosis not present

## 2022-02-03 HISTORY — PX: BREAST BIOPSY: SHX20

## 2022-03-03 ENCOUNTER — Other Ambulatory Visit: Payer: Self-pay | Admitting: General Surgery

## 2022-03-03 DIAGNOSIS — N6082 Other benign mammary dysplasias of left breast: Secondary | ICD-10-CM

## 2022-03-03 DIAGNOSIS — N6323 Unspecified lump in the left breast, lower outer quadrant: Secondary | ICD-10-CM | POA: Diagnosis not present

## 2022-03-09 ENCOUNTER — Other Ambulatory Visit: Payer: Self-pay | Admitting: General Surgery

## 2022-03-09 DIAGNOSIS — N6082 Other benign mammary dysplasias of left breast: Secondary | ICD-10-CM

## 2022-04-08 ENCOUNTER — Encounter (HOSPITAL_BASED_OUTPATIENT_CLINIC_OR_DEPARTMENT_OTHER): Payer: Self-pay | Admitting: General Surgery

## 2022-04-08 ENCOUNTER — Other Ambulatory Visit: Payer: Self-pay

## 2022-04-15 DIAGNOSIS — I10 Essential (primary) hypertension: Secondary | ICD-10-CM

## 2022-04-15 DIAGNOSIS — Z01818 Encounter for other preprocedural examination: Secondary | ICD-10-CM

## 2022-04-21 DIAGNOSIS — E039 Hypothyroidism, unspecified: Secondary | ICD-10-CM | POA: Diagnosis not present

## 2022-04-21 DIAGNOSIS — E782 Mixed hyperlipidemia: Secondary | ICD-10-CM | POA: Diagnosis not present

## 2022-04-21 DIAGNOSIS — M85852 Other specified disorders of bone density and structure, left thigh: Secondary | ICD-10-CM | POA: Diagnosis not present

## 2022-04-27 ENCOUNTER — Encounter (HOSPITAL_BASED_OUTPATIENT_CLINIC_OR_DEPARTMENT_OTHER): Payer: Self-pay | Admitting: General Surgery

## 2022-04-28 DIAGNOSIS — Z0000 Encounter for general adult medical examination without abnormal findings: Secondary | ICD-10-CM | POA: Diagnosis not present

## 2022-04-28 DIAGNOSIS — Z23 Encounter for immunization: Secondary | ICD-10-CM | POA: Diagnosis not present

## 2022-05-01 ENCOUNTER — Encounter (HOSPITAL_BASED_OUTPATIENT_CLINIC_OR_DEPARTMENT_OTHER)
Admission: RE | Admit: 2022-05-01 | Discharge: 2022-05-01 | Disposition: A | Discharge status: Home / Self Care | Payer: BC Managed Care – PPO | Source: Ambulatory Visit | Attending: General Surgery | Admitting: General Surgery

## 2022-05-01 ENCOUNTER — Ambulatory Visit
Admission: RE | Admit: 2022-05-01 | Discharge: 2022-05-01 | Disposition: A | Discharge status: Home / Self Care | Payer: BC Managed Care – PPO | Source: Ambulatory Visit | Attending: General Surgery | Admitting: General Surgery

## 2022-05-01 DIAGNOSIS — Z01810 Encounter for preprocedural cardiovascular examination: Secondary | ICD-10-CM | POA: Insufficient documentation

## 2022-05-01 DIAGNOSIS — I10 Essential (primary) hypertension: Secondary | ICD-10-CM | POA: Diagnosis not present

## 2022-05-01 DIAGNOSIS — Z803 Family history of malignant neoplasm of breast: Secondary | ICD-10-CM | POA: Diagnosis not present

## 2022-05-01 DIAGNOSIS — E039 Hypothyroidism, unspecified: Secondary | ICD-10-CM | POA: Diagnosis not present

## 2022-05-01 DIAGNOSIS — N6082 Other benign mammary dysplasias of left breast: Secondary | ICD-10-CM

## 2022-05-01 DIAGNOSIS — R928 Other abnormal and inconclusive findings on diagnostic imaging of breast: Secondary | ICD-10-CM | POA: Diagnosis not present

## 2022-05-01 DIAGNOSIS — N6022 Fibroadenosis of left breast: Secondary | ICD-10-CM | POA: Diagnosis not present

## 2022-05-01 HISTORY — PX: BREAST BIOPSY: SHX20

## 2022-05-01 MED ORDER — ENSURE PRE-SURGERY PO LIQD: 296.0000 mL | Freq: Once | ORAL | Status: DC

## 2022-05-01 NOTE — Progress Notes (Signed)

## 2022-05-03 NOTE — Anesthesia Preprocedure Evaluation (Addendum)
Anesthesia Evaluation  Patient identified by MRN, date of birth, ID band Patient awake    Reviewed: Allergy & Precautions, NPO status , Patient's Chart, lab work & pertinent test results  Airway Mallampati: II  TM Distance: >3 FB Neck ROM: Full    Dental no notable dental hx. (+) Implants, Teeth Intact, Dental Advisory Given   Pulmonary    Pulmonary exam normal breath sounds clear to auscultation       Cardiovascular hypertension, Normal cardiovascular exam Rhythm:Regular Rate:Normal     Neuro/Psych    GI/Hepatic   Endo/Other  Hypothyroidism    Renal/GU      Musculoskeletal   Abdominal   Peds  Hematology   Anesthesia Other Findings   Reproductive/Obstetrics                             Anesthesia Physical Anesthesia Plan  ASA: 2  Anesthesia Plan: General   Post-op Pain Management: Tylenol PO (pre-op)* and Precedex   Induction: Intravenous  PONV Risk Score and Plan: 4 or greater and Treatment may vary due to age or medical condition, Midazolam, Ondansetron, TIVA and Propofol infusion  Airway Management Planned: LMA  Additional Equipment: None  Intra-op Plan:   Post-operative Plan: Extubation in OR  Informed Consent: I have reviewed the patients History and Physical, chart, labs and discussed the procedure including the risks, benefits and alternatives for the proposed anesthesia with the patient or authorized representative who has indicated his/her understanding and acceptance.     Dental advisory given  Plan Discussed with: Surgeon and Anesthesiologist  Anesthesia Plan Comments: (LMA TIVA GA)       Anesthesia Quick Evaluation

## 2022-05-04 ENCOUNTER — Other Ambulatory Visit: Payer: Self-pay

## 2022-05-04 ENCOUNTER — Ambulatory Visit (HOSPITAL_BASED_OUTPATIENT_CLINIC_OR_DEPARTMENT_OTHER): Payer: BC Managed Care – PPO | Admitting: Anesthesiology

## 2022-05-04 ENCOUNTER — Ambulatory Visit
Admission: RE | Admit: 2022-05-04 | Discharge: 2022-05-04 | Disposition: A | Discharge status: Home / Self Care | Payer: BC Managed Care – PPO | Source: Ambulatory Visit | Attending: General Surgery | Admitting: General Surgery

## 2022-05-04 ENCOUNTER — Encounter (HOSPITAL_BASED_OUTPATIENT_CLINIC_OR_DEPARTMENT_OTHER): Payer: Self-pay | Admitting: General Surgery

## 2022-05-04 ENCOUNTER — Ambulatory Visit (HOSPITAL_BASED_OUTPATIENT_CLINIC_OR_DEPARTMENT_OTHER)
Admission: RE | Admit: 2022-05-04 | Discharge: 2022-05-04 | Disposition: A | Discharge status: Home / Self Care | Payer: BC Managed Care – PPO | Source: Ambulatory Visit | Attending: General Surgery | Admitting: General Surgery

## 2022-05-04 ENCOUNTER — Encounter (HOSPITAL_BASED_OUTPATIENT_CLINIC_OR_DEPARTMENT_OTHER)
Admission: RE | Disposition: A | Discharge status: Home / Self Care | Payer: Self-pay | Source: Ambulatory Visit | Attending: General Surgery

## 2022-05-04 DIAGNOSIS — N6323 Unspecified lump in the left breast, lower outer quadrant: Secondary | ICD-10-CM | POA: Diagnosis not present

## 2022-05-04 DIAGNOSIS — E039 Hypothyroidism, unspecified: Secondary | ICD-10-CM | POA: Insufficient documentation

## 2022-05-04 DIAGNOSIS — N6012 Diffuse cystic mastopathy of left breast: Secondary | ICD-10-CM | POA: Diagnosis not present

## 2022-05-04 DIAGNOSIS — N6022 Fibroadenosis of left breast: Secondary | ICD-10-CM | POA: Insufficient documentation

## 2022-05-04 DIAGNOSIS — I10 Essential (primary) hypertension: Secondary | ICD-10-CM | POA: Insufficient documentation

## 2022-05-04 DIAGNOSIS — N6082 Other benign mammary dysplasias of left breast: Secondary | ICD-10-CM | POA: Diagnosis not present

## 2022-05-04 DIAGNOSIS — R928 Other abnormal and inconclusive findings on diagnostic imaging of breast: Secondary | ICD-10-CM | POA: Diagnosis not present

## 2022-05-04 DIAGNOSIS — Z803 Family history of malignant neoplasm of breast: Secondary | ICD-10-CM | POA: Insufficient documentation

## 2022-05-04 DIAGNOSIS — N6489 Other specified disorders of breast: Secondary | ICD-10-CM | POA: Diagnosis not present

## 2022-05-04 DIAGNOSIS — Z01818 Encounter for other preprocedural examination: Secondary | ICD-10-CM

## 2022-05-04 DIAGNOSIS — R921 Mammographic calcification found on diagnostic imaging of breast: Secondary | ICD-10-CM | POA: Diagnosis not present

## 2022-05-04 HISTORY — DX: Hypothyroidism, unspecified: E03.9

## 2022-05-04 HISTORY — PX: BREAST EXCISIONAL BIOPSY: SUR124

## 2022-05-04 HISTORY — PX: RADIOACTIVE SEED GUIDED EXCISIONAL BREAST BIOPSY: SHX6490

## 2022-05-04 SURGERY — RADIOACTIVE SEED GUIDED BREAST BIOPSY
Anesthesia: General | Site: Breast | Laterality: Left

## 2022-05-04 MED ORDER — CHLORHEXIDINE GLUCONATE CLOTH 2 % EX PADS: 6.0000 | MEDICATED_PAD | Freq: Once | CUTANEOUS | Status: DC

## 2022-05-04 MED ORDER — DEXAMETHASONE SODIUM PHOSPHATE 4 MG/ML IJ SOLN
INTRAMUSCULAR | Status: DC | PRN
  Administered 2022-05-04: 8 mg via INTRAVENOUS

## 2022-05-04 MED ORDER — ACETAMINOPHEN 500 MG PO TABS: 1000.0000 mg | ORAL_TABLET | ORAL | Status: DC

## 2022-05-04 MED ORDER — OXYCODONE HCL 5 MG PO TABS
ORAL_TABLET | ORAL | Status: AC
  Filled 2022-05-04: qty 1

## 2022-05-04 MED ORDER — ONDANSETRON HCL 4 MG/2ML IJ SOLN: 4.0000 mg | Freq: Once | INTRAMUSCULAR | Status: DC | PRN

## 2022-05-04 MED ORDER — MIDAZOLAM HCL 5 MG/5ML IJ SOLN
INTRAMUSCULAR | Status: DC | PRN
  Administered 2022-05-04: 2 mg via INTRAVENOUS

## 2022-05-04 MED ORDER — LACTATED RINGERS IV SOLN: INTRAVENOUS | Status: DC

## 2022-05-04 MED ORDER — OXYCODONE HCL 5 MG/5ML PO SOLN: 5.0000 mg | Freq: Once | ORAL | Status: AC | PRN

## 2022-05-04 MED ORDER — ONDANSETRON HCL 4 MG/2ML IJ SOLN
INTRAMUSCULAR | Status: DC | PRN
  Administered 2022-05-04: 4 mg via INTRAVENOUS

## 2022-05-04 MED ORDER — KETOROLAC TROMETHAMINE 30 MG/ML IJ SOLN
INTRAMUSCULAR | Status: AC
  Filled 2022-05-04: qty 1

## 2022-05-04 MED ORDER — MIDAZOLAM HCL 2 MG/2ML IJ SOLN
INTRAMUSCULAR | Status: AC
  Filled 2022-05-04: qty 2

## 2022-05-04 MED ORDER — PROPOFOL 500 MG/50ML IV EMUL
INTRAVENOUS | Status: AC
  Filled 2022-05-04: qty 50

## 2022-05-04 MED ORDER — HYDROMORPHONE HCL 1 MG/ML IJ SOLN
INTRAMUSCULAR | Status: AC
  Filled 2022-05-04: qty 0.5

## 2022-05-04 MED ORDER — LIDOCAINE 2% (20 MG/ML) 5 ML SYRINGE
INTRAMUSCULAR | Status: AC
  Filled 2022-05-04: qty 5

## 2022-05-04 MED ORDER — HYDROMORPHONE HCL 1 MG/ML IJ SOLN
0.2500 mg | INTRAMUSCULAR | Status: DC | PRN
  Administered 2022-05-04: 0.5 mg via INTRAVENOUS

## 2022-05-04 MED ORDER — OXYCODONE HCL 5 MG PO TABS
5.0000 mg | ORAL_TABLET | Freq: Once | ORAL | Status: AC | PRN
  Administered 2022-05-04: 5 mg via ORAL

## 2022-05-04 MED ORDER — ONDANSETRON HCL 4 MG/2ML IJ SOLN
INTRAMUSCULAR | Status: AC
  Filled 2022-05-04: qty 2

## 2022-05-04 MED ORDER — PROPOFOL 10 MG/ML IV BOLUS
INTRAVENOUS | Status: DC | PRN
  Administered 2022-05-04: 150 mg via INTRAVENOUS

## 2022-05-04 MED ORDER — CEFAZOLIN SODIUM-DEXTROSE 2-4 GM/100ML-% IV SOLN
2.0000 g | INTRAVENOUS | Status: AC
  Administered 2022-05-04: 2 g via INTRAVENOUS

## 2022-05-04 MED ORDER — FENTANYL CITRATE (PF) 100 MCG/2ML IJ SOLN
INTRAMUSCULAR | Status: DC | PRN
  Administered 2022-05-04: 50 ug via INTRAVENOUS

## 2022-05-04 MED ORDER — FENTANYL CITRATE (PF) 100 MCG/2ML IJ SOLN
INTRAMUSCULAR | Status: AC
  Filled 2022-05-04: qty 2

## 2022-05-04 MED ORDER — KETOROLAC TROMETHAMINE 30 MG/ML IJ SOLN
30.0000 mg | Freq: Once | INTRAMUSCULAR | Status: AC | PRN
  Administered 2022-05-04: 30 mg via INTRAVENOUS

## 2022-05-04 MED ORDER — BUPIVACAINE HCL (PF) 0.25 % IJ SOLN
INTRAMUSCULAR | Status: DC | PRN
  Administered 2022-05-04: 10 mL

## 2022-05-04 MED ORDER — LIDOCAINE HCL (CARDIAC) PF 100 MG/5ML IV SOSY
PREFILLED_SYRINGE | INTRAVENOUS | Status: DC | PRN
  Administered 2022-05-04: 100 mg via INTRAVENOUS

## 2022-05-04 MED ORDER — DEXAMETHASONE SODIUM PHOSPHATE 10 MG/ML IJ SOLN
INTRAMUSCULAR | Status: AC
  Filled 2022-05-04: qty 1

## 2022-05-04 MED ORDER — PROPOFOL 500 MG/50ML IV EMUL
INTRAVENOUS | Status: DC | PRN
  Administered 2022-05-04: 200 ug/kg/min via INTRAVENOUS

## 2022-05-04 SURGICAL SUPPLY — 54 items
ADH SKN CLS APL DERMABOND .7 (GAUZE/BANDAGES/DRESSINGS) ×1
APL PRP STRL LF DISP 70% ISPRP (MISCELLANEOUS) ×1
APPLIER CLIP 9.375 MED OPEN (MISCELLANEOUS)
APR CLP MED 9.3 20 MLT OPN (MISCELLANEOUS)
BINDER BREAST LRG (GAUZE/BANDAGES/DRESSINGS) IMPLANT
BINDER BREAST MEDIUM (GAUZE/BANDAGES/DRESSINGS) IMPLANT
BINDER BREAST XLRG (GAUZE/BANDAGES/DRESSINGS) IMPLANT
BINDER BREAST XXLRG (GAUZE/BANDAGES/DRESSINGS) IMPLANT
BLADE SURG 15 STRL LF DISP TIS (BLADE) ×1 IMPLANT
BLADE SURG 15 STRL SS (BLADE) ×1
CANISTER SUC SOCK COL 7IN (MISCELLANEOUS) IMPLANT
CANISTER SUCT 1200ML W/VALVE (MISCELLANEOUS) IMPLANT
CHLORAPREP W/TINT 26 (MISCELLANEOUS) ×1 IMPLANT
CLIP APPLIE 9.375 MED OPEN (MISCELLANEOUS) IMPLANT
CLIP TI WIDE RED SMALL 6 (CLIP) IMPLANT
COVER BACK TABLE 60X90IN (DRAPES) ×1 IMPLANT
COVER MAYO STAND STRL (DRAPES) ×1 IMPLANT
COVER PROBE CYLINDRICAL 5X96 (MISCELLANEOUS) ×1 IMPLANT
DERMABOND ADVANCED .7 DNX12 (GAUZE/BANDAGES/DRESSINGS) ×1 IMPLANT
DRAPE LAPAROSCOPIC ABDOMINAL (DRAPES) ×1 IMPLANT
DRAPE UTILITY XL STRL (DRAPES) ×1 IMPLANT
DRSG TEGADERM 4X4.75 (GAUZE/BANDAGES/DRESSINGS) IMPLANT
ELECT COATED BLADE 2.86 ST (ELECTRODE) ×1 IMPLANT
ELECT REM PT RETURN 9FT ADLT (ELECTROSURGICAL) ×1
ELECTRODE REM PT RTRN 9FT ADLT (ELECTROSURGICAL) ×1 IMPLANT
GAUZE SPONGE 4X4 12PLY STRL LF (GAUZE/BANDAGES/DRESSINGS) IMPLANT
GLOVE BIO SURGEON STRL SZ7 (GLOVE) ×2 IMPLANT
GLOVE BIOGEL PI IND STRL 7.5 (GLOVE) ×1 IMPLANT
GOWN STRL REUS W/ TWL LRG LVL3 (GOWN DISPOSABLE) ×2 IMPLANT
GOWN STRL REUS W/TWL LRG LVL3 (GOWN DISPOSABLE) ×2
HEMOSTAT ARISTA ABSORB 3G PWDR (HEMOSTASIS) IMPLANT
KIT MARKER MARGIN INK (KITS) ×1 IMPLANT
NDL HYPO 25X1 1.5 SAFETY (NEEDLE) ×1 IMPLANT
NEEDLE HYPO 25X1 1.5 SAFETY (NEEDLE) ×1 IMPLANT
NS IRRIG 1000ML POUR BTL (IV SOLUTION) IMPLANT
PACK BASIN DAY SURGERY FS (CUSTOM PROCEDURE TRAY) ×1 IMPLANT
PENCIL SMOKE EVACUATOR (MISCELLANEOUS) ×1 IMPLANT
RETRACTOR ONETRAX LX 90X20 (MISCELLANEOUS) IMPLANT
SLEEVE SCD COMPRESS KNEE MED (STOCKING) ×1 IMPLANT
SPIKE FLUID TRANSFER (MISCELLANEOUS) IMPLANT
SPONGE T-LAP 4X18 ~~LOC~~+RFID (SPONGE) ×1 IMPLANT
STRIP CLOSURE SKIN 1/2X4 (GAUZE/BANDAGES/DRESSINGS) ×1 IMPLANT
SUT MNCRL AB 4-0 PS2 18 (SUTURE) IMPLANT
SUT MON AB 5-0 PS2 18 (SUTURE) IMPLANT
SUT SILK 2 0 SH (SUTURE) IMPLANT
SUT VIC AB 2-0 SH 27 (SUTURE) ×1
SUT VIC AB 2-0 SH 27XBRD (SUTURE) ×1 IMPLANT
SUT VIC AB 3-0 SH 27 (SUTURE) ×1
SUT VIC AB 3-0 SH 27X BRD (SUTURE) ×1 IMPLANT
SYR CONTROL 10ML LL (SYRINGE) ×1 IMPLANT
TOWEL GREEN STERILE FF (TOWEL DISPOSABLE) ×1 IMPLANT
TRAY FAXITRON CT DISP (TRAY / TRAY PROCEDURE) ×1 IMPLANT
TUBE CONNECTING 20X1/4 (TUBING) IMPLANT
YANKAUER SUCT BULB TIP NO VENT (SUCTIONS) IMPLANT

## 2022-05-04 NOTE — Anesthesia Procedure Notes (Signed)
Procedure Name: LMA Insertion Date/Time: 05/04/2022 7:59 AM  Performed by: Tawni Millers, CRNAPre-anesthesia Checklist: Patient identified, Emergency Drugs available, Suction available and Patient being monitored Patient Re-evaluated:Patient Re-evaluated prior to induction Oxygen Delivery Method: Circle system utilized Preoxygenation: Pre-oxygenation with 100% oxygen Induction Type: IV induction Ventilation: Mask ventilation without difficulty LMA: LMA inserted LMA Size: 4.0 Number of attempts: 1 Airway Equipment and Method: Bite block Placement Confirmation: positive ETCO2 Tube secured with: Tape Dental Injury: Teeth and Oropharynx as per pre-operative assessment

## 2022-05-04 NOTE — Transfer of Care (Signed)
Immediate Anesthesia Transfer of Care Note  Patient: Belinda Miller  Procedure(s) Performed: RADIOACTIVE SEED GUIDED EXCISIONAL LEFT BREAST BIOPSY (Left: Breast)  Patient Location: PACU  Anesthesia Type:General  Level of Consciousness: sedated  Airway & Oxygen Therapy: Patient Spontanous Breathing and Patient connected to face mask oxygen  Post-op Assessment: Report given to RN and Post -op Vital signs reviewed and stable  Post vital signs: Reviewed and stable  Last Vitals:  Vitals Value Taken Time  BP 88/63 05/04/22 0803  Temp    Pulse 65 05/04/22 0805  Resp 7 05/04/22 0805  SpO2 100 % 05/04/22 0805  Vitals shown include unvalidated device data.  Last Pain:  Vitals:   05/04/22 0637  TempSrc: Oral  PainSc: 0-No pain      Patients Stated Pain Goal: 7 (21/82/88 3374)  Complications: No notable events documented.

## 2022-05-04 NOTE — Op Note (Signed)
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 Serita Grammes Anesthesia: General Estimated blood loss: Minimal Complications: None Drains: None Specimens: Left breast tissue marked with paint containing seed and clips (X clip is in specimen) Sponge count was correct completion Disposition recovery stable addition   Indications: 62 year old female who is here with her mother who I am taking care of breast cancer when she was in her 82s who presents after an abnormal screening mammogram.  She underwent a mammogram without a mass or discharge that showed B density breast tissue. Her initial showed a 5 mm irregular masslike structure in the inferior left breast. About 3 cm posterolateral to that there was an additional smaller focal area of distortion as well as another area of distortion noted. There were no ultrasound correlate. There was no axillary adenopathy. She underwent 3 stereotactic biopsies. BX clip is at the mass and the distortions have a coil and a ribbon clip. The BX clip was the mass was focal flat epithelial atypia. This was concordant and referred for consideration of excision. The other 2 are both benign breast tissue and read as concordant.we discussed options and elected to excise the FEA area.    Procedure: After informed consent was obtained the patient was taken to the operating room.  She had seeds placed prior.  I had these mammograms available for my review.  She was placed under general anesthesia without complication.  She was then prepped and draped in standard sterile surgical fashion.  Surgical timeout was then performed.   I noted the seeds in the lower pole of the breast. I made an inframammary incision in order to hide the scar. I infiltrated Marcaine throughout this area prior to beginning.  I then used the neoprobe to guide removal of the seed  and the surrounding tissue. Mammogram confirmed removal of the x clip and the seed.  Hemostasis was obtained.  I closed this with 2-0 Vicryl, 3-0 Vicryl, and 4 Monocryl.  Glue and Steri-Strips were applied.  She tolerated this well was transferred to recovery stable.

## 2022-05-04 NOTE — Interval H&P Note (Signed)
History and Physical Interval Note:  05/04/2022 7:06 AM  Belinda Miller  has presented today for surgery, with the diagnosis of LEFT BREAST MASS.  The various methods of treatment have been discussed with the patient and family. After consideration of risks, benefits and other options for treatment, the patient has consented to  Procedure(s): RADIOACTIVE SEED GUIDED EXCISIONAL LEFT BREAST BIOPSY (Left) as a surgical intervention.  The patient's history has been reviewed, patient examined, no change in status, stable for surgery.  I have reviewed the patient's chart and labs.  Questions were answered to the patient's satisfaction.     Rolm Bookbinder

## 2022-05-04 NOTE — H&P (Signed)
62 year old female who is here with her mother who I am taking care of breast cancer when she was in her 59s who presents after an abnormal screening mammogram. She has no prior breast history. She underwent a mammogram without a mass or discharge that showed B density breast tissue. Her initial showed a 5 mm irregular masslike structure in the inferior left breast. About 3 cm posterolateral to that there was an additional smaller focal area of distortion as well as another area of distortion noted. There were no ultrasound correlate. There was no axillary adenopathy. She underwent 3 stereotactic biopsies. BX clip is at the mass and the distortions have a coil and a ribbon clip. The BX clip was the mass was focal flat epithelial atypia. This was concordant and referred for consideration of excision. The other 2 are both benign breast tissue and read as concordant. She is here to discuss her options.  Review of Systems: A complete review of systems was obtained from the patient. I have reviewed this information and discussed as appropriate with the patient. See HPI as well for other ROS.  Review of Systems  All other systems reviewed and are negative.   Medical History: Past Medical History:  Diagnosis Date  Anxiety  Arthritis  Hypertension  Thyroid disease   There is no problem list on file for this patient.  History reviewed. No pertinent surgical history.   No Known Allergies  Current Outpatient Medications on File Prior to Visit  Medication Sig Dispense Refill  ALPRAZolam (XANAX) 0.25 MG tablet 1 TABLET ORALLY EVERY 8 HOURS AS NEEDED FOR SEVERE ANXIETY  amLODIPine (NORVASC) 2.5 MG tablet Take 1 tablet by mouth once daily  calcium carbonate 600 mg calcium (1,500 mg) Tab tablet Take by mouth  cholecalciferol (VITAMIN D3) 1000 unit tablet Take by mouth  levothyroxine (SYNTHROID) 50 MCG tablet TAKE 1 TABLET BY MOUTH EVERY DAY IN THE MORNING ON EMPTY STOMACH FOR 90 DAYS  omega  3-dha-epa-fish oil (FISH OIL) 1,000 mg (120 mg-180 mg) Cap Fish Oil  PARoxetine (PAXIL) 20 MG tablet TAKE 1 TABLET BY MOUTH EVERY DAY IN THE MORNING FOR 90 DAYS  rosuvastatin (CRESTOR) 10 MG tablet  zinc sulfate (ZINC-15 ORAL) Zinc   No current facility-administered medications on file prior to visit.   History reviewed. No pertinent family history.   Social History   Tobacco Use  Smoking Status Never  Smokeless Tobacco Never  Marital status: Married  Tobacco Use  Smoking status: Never  Smokeless tobacco: Never  Substance and Sexual Activity  Alcohol use: Yes  Drug use: Never   Objective:   Vitals:   Body mass index is 28.67 kg/m.  Physical Exam Vitals reviewed.  Chest:  Breasts: Right: No inverted nipple, mass or nipple discharge.  Left: Inverted nipple (longstanding) present. No mass or nipple discharge.  Lymphadenopathy:  Upper Body:  Right upper body: No supraclavicular adenopathy.  Left upper body: No supraclavicular or axillary adenopathy.  Neurological:  Mental Status: She is alert.    Assessment and Plan:   Mass of lower outer quadrant of left breast  Left breast seed guided excisional biopsy  We discussed her pathology and all of her options today. I discussed follow-up mammogram in 6 months. We discussed the highest upgrade rate for this would be up to 5% with reviewing the literature. There certainly is some data that says you can observe these. We also discussed a seed guided excisional biopsy. We discussed his only of the FEA area. The  other 2 can just be followed. We discussed surgery, risks, and recovery. She would like to proceed with excision so we will plan on doing that.

## 2022-05-04 NOTE — Discharge Instructions (Addendum)
[place] Office Phone Number [phone]  POST OP INSTRUCTIONS Take 400 mg of ibuprofen every 8 hours or 650 mg tylenol every 6 hours for next 72 hours then as needed. Use ice several times daily also.  A prescription for pain medication may be given to you upon discharge.  Take your pain medication as prescribed, if needed.  If narcotic pain medicine is not needed, then you may take acetaminophen (Tylenol), naprosyn (Alleve) or ibuprofen (Advil) as needed. Take your usually prescribed medications unless otherwise directed If you need a refill on your pain medication, please contact your pharmacy.  They will contact our office to request authorization.  Prescriptions will not be filled after 5pm or on week-ends. You should eat very light the first 24 hours after surgery, such as soup, crackers, pudding, etc.  Resume your normal diet the day after surgery. Most patients will experience some swelling and bruising in the breast.  Ice packs and a good support bra will help.  Wear the breast binder provided or a sports bra for 72 hours day and night.  After that wear a sports bra during the day until you return to the office. Swelling and bruising can take several days to resolve.  It is common to experience some constipation if taking pain medication after surgery.  Increasing fluid intake and taking a stool softener will usually help or prevent this problem from occurring.  A mild laxative (Milk of Magnesia or Miralax) should be taken according to package directions if there are no bowel movements after 48 hours. I used skin glue on the incision, you may shower in 24 hours.  The glue will flake off over the next 2-3 weeks.  Any sutures or staples will be removed at the office during your follow-up visit. ACTIVITIES:  You may resume regular daily activities (gradually increasing) beginning the next day.  Wearing a good support bra or sports bra minimizes pain and swelling.  You may have  sexual intercourse when it is comfortable. You may drive when you no longer are taking prescription pain medication, you can comfortably wear a seatbelt, and you can safely maneuver your car and apply brakes. RETURN TO WORK:  ______________________________________________________________________________________ Dennis Bast should see your doctor in the office for a follow-up appointment approximately two weeks after your surgery.  Your doctor's nurse will typically make your follow-up appointment when she calls you with your pathology report.  Expect your pathology report 3-4 business days after your surgery.  You may call to check if you do not hear from [place] after three days. OTHER INSTRUCTIONS: _______________________________________________________________________________________________ _____________________________________________________________________________________________________________________________________ _____________________________________________________________________________________________________________________________________ _____________________________________________________________________________________________________________________________________  WHEN TO CALL DR WAKEFIELD: Fever over 101.0 Nausea and/or vomiting. Extreme swelling or bruising. Continued bleeding from incision. Increased pain, redness, or drainage from the incision.  The clinic staff is available to answer your questions during regular business hours.  Please don't hesitate to call and ask to speak to one of the nurses for clinical concerns.  If you have a medical emergency, go to the nearest emergency room or call 911.  A surgeon from [facility] is always on call at the hospital.  For further questions, please visit [email or web address]   Post Anesthesia Home Care Instructions  Activity: Get plenty of rest for the remainder of the day. A responsible individual must stay  with you for 24 hours following the procedure.  For the next 24 hours, DO NOT: -Drive a car -Paediatric nurse -Drink alcoholic beverages -Take any medication unless instructed by  your physician -Make any legal decisions or sign important papers.  Meals: Start with liquid foods such as gelatin or soup. Progress to regular foods as tolerated. Avoid greasy, spicy, heavy foods. If nausea and/or vomiting occur, drink only clear liquids until the nausea and/or vomiting subsides. Call your physician if vomiting continues.  Special Instructions/Symptoms: Your throat may feel dry or sore from the anesthesia or the breathing tube placed in your throat during surgery. If this causes discomfort, gargle with warm salt water. The discomfort should disappear within 24 hours.  If you had a scopolamine patch placed behind your ear for the management of post- operative nausea and/or vomiting:  1. The medication in the patch is effective for 72 hours, after which it should be removed.  Wrap patch in a tissue and discard in the trash. Wash hands thoroughly with soap and water. 2. You may remove the patch earlier than 72 hours if you experience unpleasant side effects which may include dry mouth, dizziness or visual disturbances. 3. Avoid touching the patch. Wash your hands with soap and water after contact with the patch.    *May have Ibuprofen today at 2:30pm

## 2022-05-04 NOTE — Anesthesia Postprocedure Evaluation (Signed)
Anesthesia Post Note  Patient: Belinda Miller  Procedure(s) Performed: RADIOACTIVE SEED GUIDED EXCISIONAL LEFT BREAST BIOPSY (Left: Breast)     Patient location during evaluation: PACU Anesthesia Type: General Level of consciousness: awake and alert Pain management: pain level controlled Vital Signs Assessment: post-procedure vital signs reviewed and stable Respiratory status: spontaneous breathing, nonlabored ventilation, respiratory function stable and patient connected to nasal cannula oxygen Cardiovascular status: blood pressure returned to baseline and stable Postop Assessment: no apparent nausea or vomiting Anesthetic complications: no  No notable events documented.  Last Vitals:  Vitals:   05/04/22 0915 05/04/22 0928  BP: 123/76 138/78  Pulse: 70 72  Resp: 11 16  Temp:  (!) 36.3 C  SpO2: 97% 95%    Last Pain:  Vitals:   05/04/22 0928  TempSrc: Oral  PainSc: 3                  Barnet Glasgow

## 2022-05-05 ENCOUNTER — Encounter (HOSPITAL_BASED_OUTPATIENT_CLINIC_OR_DEPARTMENT_OTHER): Payer: Self-pay | Admitting: General Surgery

## 2022-05-05 LAB — SURGICAL PATHOLOGY

## 2022-05-19 ENCOUNTER — Encounter (HOSPITAL_COMMUNITY): Payer: Self-pay

## 2022-07-20 IMAGING — MG MM DIGITAL SCREENING BILAT W/ TOMO AND CAD
8 series · 8 of 24 positions shown · non-contrast
Comparison: Previous exam(s).

CLINICAL DATA: Screening.

EXAM:
DIGITAL SCREENING BILATERAL MAMMOGRAM WITH TOMOSYNTHESIS AND CAD
TECHNIQUE: Bilateral screening digital craniocaudal and mediolateral oblique
mammograms were obtained. Bilateral screening digital breast
tomosynthesis was performed. The images were evaluated with
computer-aided detection.

[L CC synth-2D]
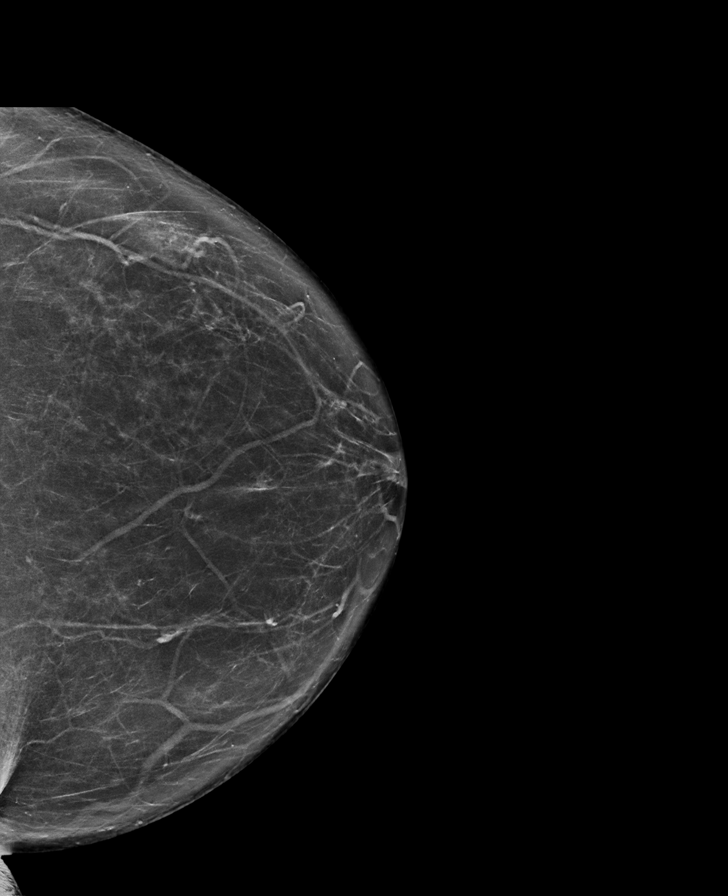

[L MLO synth-2D]
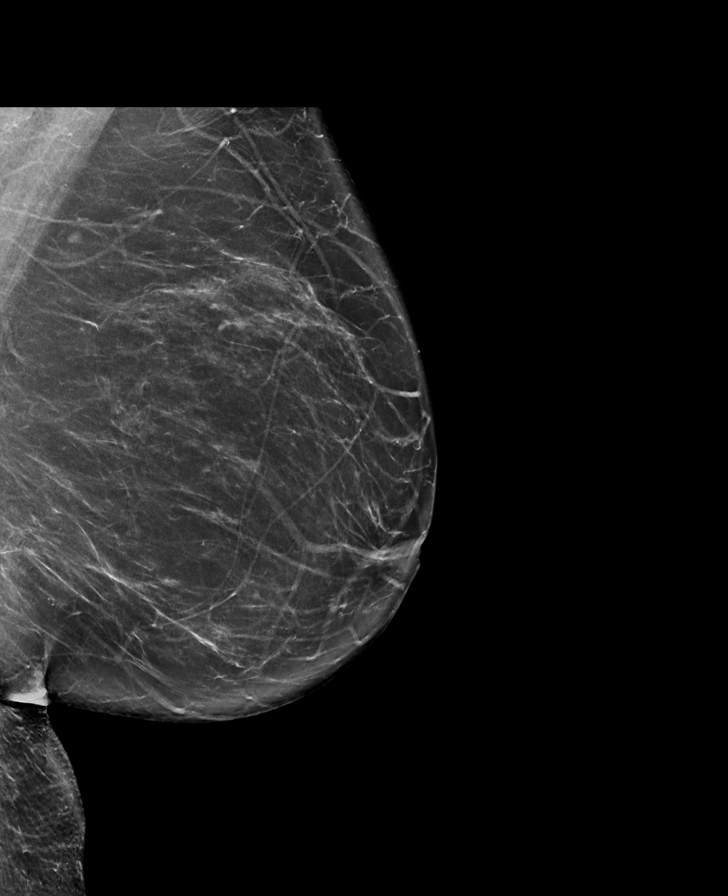

[R MLO synth-2D]
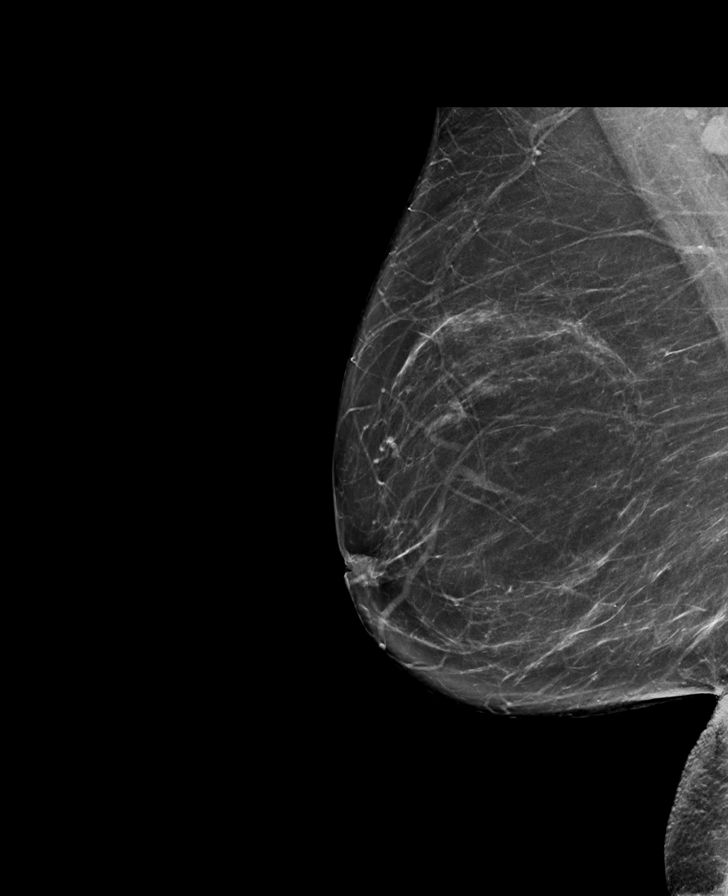

[R CC synth-2D]
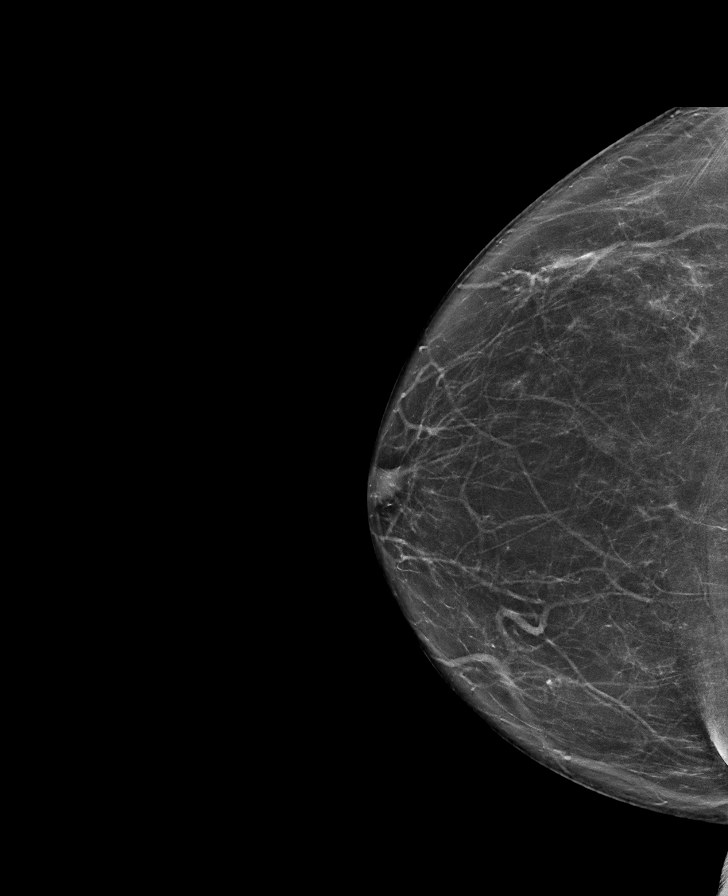

[R MLO tomo · tomo slice 43/85.0]
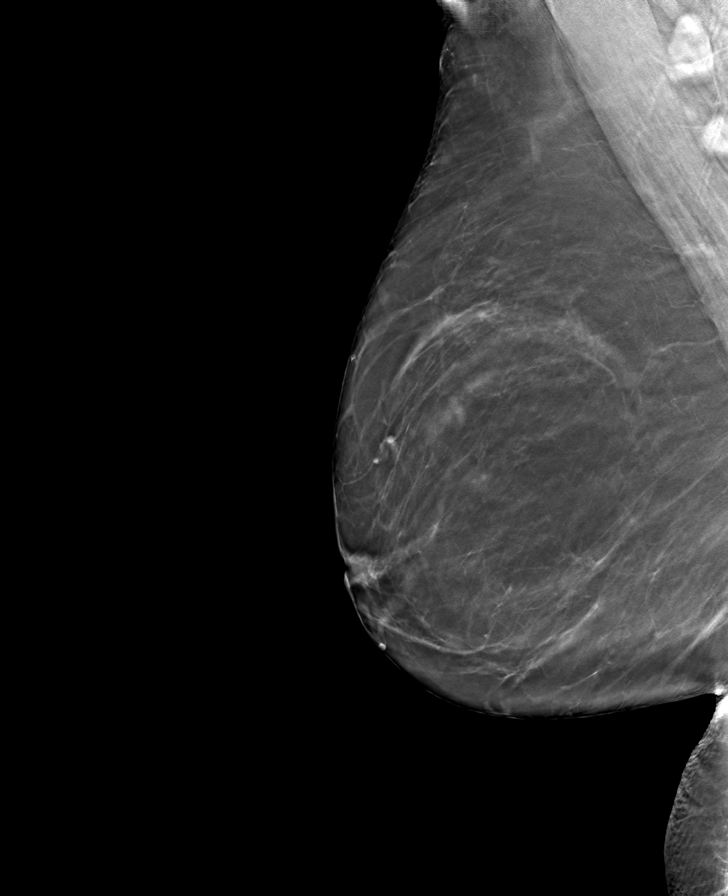

[L MLO tomo · tomo slice 45/89.0]
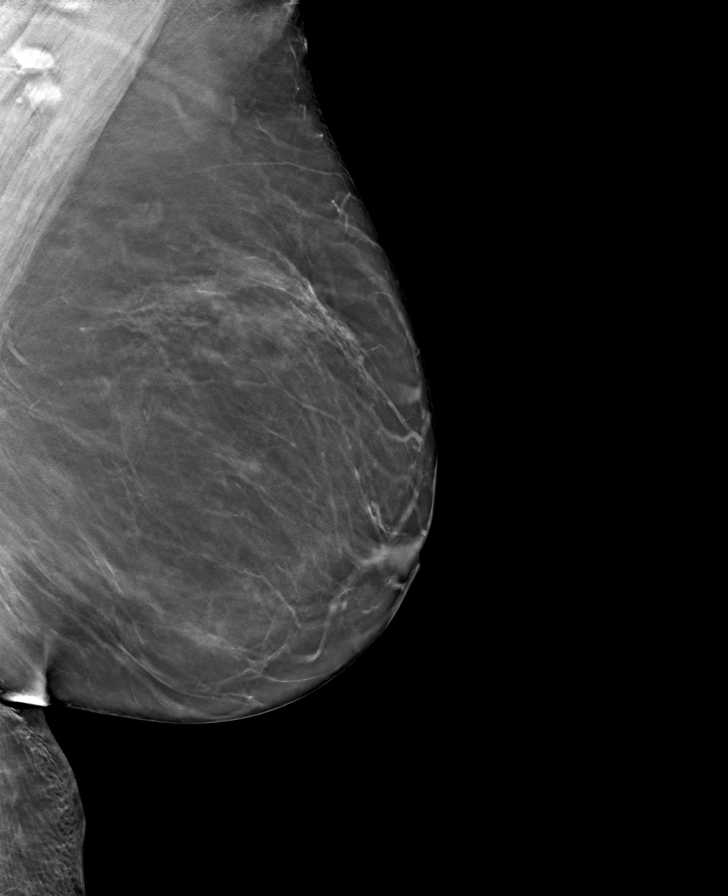

[R CC tomo · tomo slice 39/78.0]
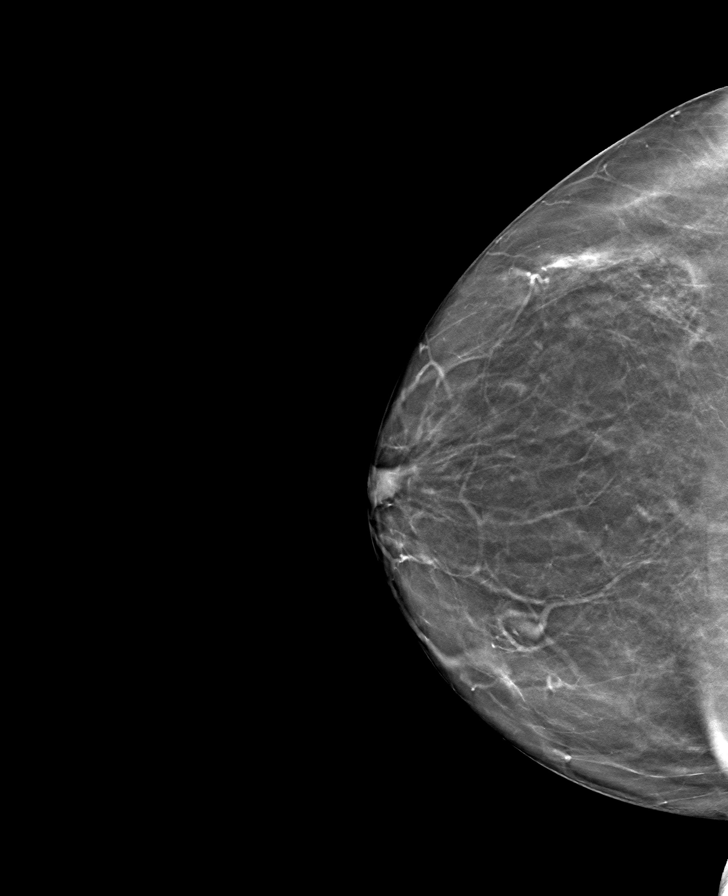

[L CC tomo · tomo slice 41/81.0]
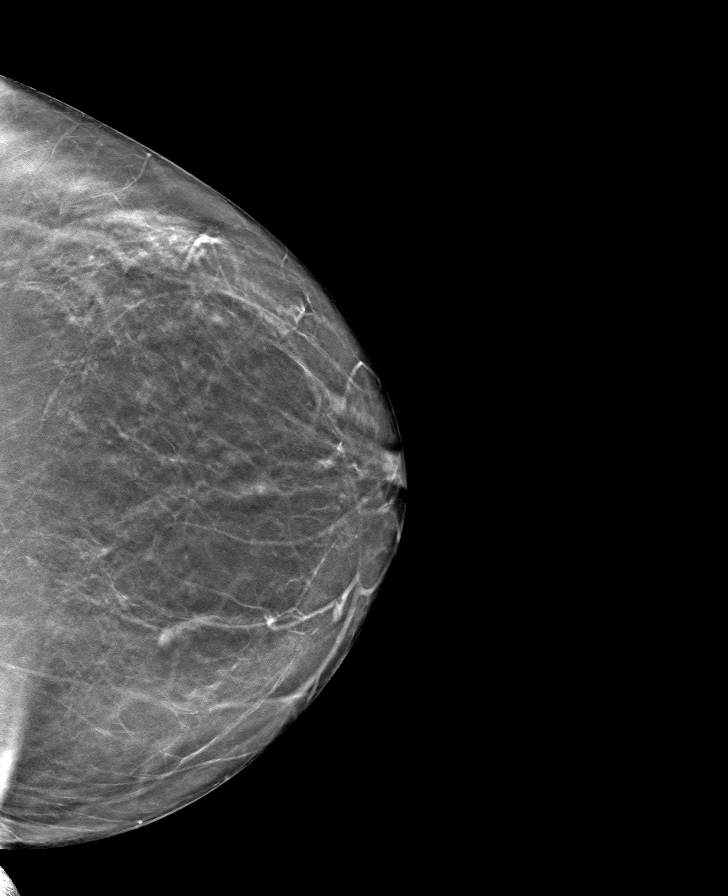

[8 of 24 positions shown; findings below may reference images not displayed]

ACR Breast Density Category b: There are scattered areas of
fibroglandular density.
FINDINGS: In the left breast, a possible mass warrants further evaluation. In
the right breast, no findings suspicious for malignancy.
IMPRESSION: Further evaluation is suggested for a possible mass in the left
breast.

RECOMMENDATION:
Diagnostic mammogram and possibly ultrasound of the left breast.
(Code:[id number])

The patient will be contacted regarding the findings, and additional
imaging will be scheduled.

BI-RADS CATEGORY  0: Incomplete. Need additional imaging evaluation
and/or prior mammograms for comparison.

## 2022-08-12 ENCOUNTER — Emergency Department (HOSPITAL_BASED_OUTPATIENT_CLINIC_OR_DEPARTMENT_OTHER): Payer: BC Managed Care – PPO

## 2022-08-12 ENCOUNTER — Emergency Department (HOSPITAL_BASED_OUTPATIENT_CLINIC_OR_DEPARTMENT_OTHER)
Admission: EM | Admit: 2022-08-12 | Discharge: 2022-08-12 | Disposition: A | Discharge status: Home / Self Care | Payer: BC Managed Care – PPO | Attending: Emergency Medicine | Admitting: Emergency Medicine

## 2022-08-12 ENCOUNTER — Other Ambulatory Visit: Payer: Self-pay

## 2022-08-12 DIAGNOSIS — S52572A Other intraarticular fracture of lower end of left radius, initial encounter for closed fracture: Secondary | ICD-10-CM | POA: Diagnosis not present

## 2022-08-12 DIAGNOSIS — Z79899 Other long term (current) drug therapy: Secondary | ICD-10-CM | POA: Insufficient documentation

## 2022-08-12 DIAGNOSIS — W2201XA Walked into wall, initial encounter: Secondary | ICD-10-CM | POA: Insufficient documentation

## 2022-08-12 DIAGNOSIS — S52502A Unspecified fracture of the lower end of left radius, initial encounter for closed fracture: Secondary | ICD-10-CM | POA: Insufficient documentation

## 2022-08-12 DIAGNOSIS — Y9302 Activity, running: Secondary | ICD-10-CM | POA: Insufficient documentation

## 2022-08-12 DIAGNOSIS — S51812A Laceration without foreign body of left forearm, initial encounter: Secondary | ICD-10-CM

## 2022-08-12 DIAGNOSIS — I10 Essential (primary) hypertension: Secondary | ICD-10-CM | POA: Insufficient documentation

## 2022-08-12 DIAGNOSIS — S6992XA Unspecified injury of left wrist, hand and finger(s), initial encounter: Secondary | ICD-10-CM | POA: Diagnosis not present

## 2022-08-12 MED ORDER — HYDROMORPHONE HCL 1 MG/ML IJ SOLN
0.5000 mg | Freq: Once | INTRAMUSCULAR | Status: DC
  Filled 2022-08-12: qty 1

## 2022-08-12 MED ORDER — OXYCODONE-ACETAMINOPHEN 5-325 MG PO TABS
1.0000 | ORAL_TABLET | Freq: Once | ORAL | Status: AC
  Administered 2022-08-12: 1 via ORAL
  Filled 2022-08-12: qty 1

## 2022-08-12 MED ORDER — HYDROMORPHONE HCL 1 MG/ML IJ SOLN
0.5000 mg | Freq: Once | INTRAMUSCULAR | Status: AC
  Administered 2022-08-12: 0.5 mg via INTRAMUSCULAR
  Filled 2022-08-12: qty 1

## 2022-08-12 MED ORDER — LIDOCAINE-EPINEPHRINE (PF) 2 %-1:200000 IJ SOLN
10.0000 mL | Freq: Once | INTRAMUSCULAR | Status: AC
  Administered 2022-08-12: 10 mL via INTRADERMAL
  Filled 2022-08-12: qty 20

## 2022-08-12 MED ORDER — OXYCODONE-ACETAMINOPHEN 5-325 MG PO TABS: 1.0000 | ORAL_TABLET | Freq: Four times a day (QID) | ORAL | 0 refills | Status: AC | PRN

## 2022-08-12 NOTE — ED Notes (Signed)
Finger traction applied to pt L hand with 3lbs applied

## 2022-08-12 NOTE — ED Notes (Signed)
RN reviewed discharge instructions with pt. Pt verbalized understanding and had no further questions. VSS upon discharge.  

## 2022-08-12 NOTE — ED Notes (Signed)
-  Patients daughter Morrie Sheldon called wanting an update. Phone number is listed in chart.

## 2022-08-12 NOTE — ED Provider Notes (Signed)
[place] EMERGENCY DEPARTMENT AT [facility] Provider Note   CSN: [id number] Arrival date & time: 08/12/22  1844     History  Chief Complaint  Patient presents with   L wrist injury    AAREN MCINTOSH is a 62 y.o. female.  The history is provided by the patient and medical records. No language interpreter was used.     62 year old female significant history of hypertension, presenting for evaluation of wrist injury.  Patient reports she was outside this afternoon and she felt like an old oak tree outside her house was going to fall on her.  She tries to run away and ran into the wall on the side of her house, injuring her left wrist in the process.  She did struck her face but no loss of consciousness.  Her pain is primarily to her left wrist.  She also suffered a laceration to her forearm.  She denies any elbow or shoulder pain.  She denies any dental pain.  No headache or neck pain.  No specific treatment tried prior to arrival.  She is up-to-date with tetanus.    Home Medications Prior to Admission medications   Medication Sig Start Date End Date Taking? Authorizing Provider  amLODipine (NORVASC) 2.5 MG tablet Take 2.5 mg by mouth daily.    [provider]  calcium carbonate (OSCAL) 1500 (600 Ca) MG TABS tablet Take by mouth 2 (two) times daily with a meal.    [provider]  cholecalciferol (VITAMIN D3) 25 MCG (1000 UNIT) tablet Take 1,000 Units by mouth daily.    [provider]  clobetasol ointment (TEMOVATE) 0.05 % Apply 1 application topically 2 (two) times daily. 09/26/19   Sheffield, Judye Bos, PA-C  levothyroxine (SYNTHROID) 50 MCG tablet Take 50 mcg by mouth daily before breakfast.    [provider]  lisinopril (ZESTRIL) 10 MG tablet Take 10 mg by mouth daily.    [provider]  Omega-3 Fatty Acids (FISH OIL) 500 MG CAPS Take by mouth.    [provider]  PARoxetine (PAXIL) 20 MG tablet Take 20 mg by mouth  daily.    [provider]  raloxifene (EVISTA) 60 MG tablet Take 60 mg by mouth daily.    [provider]  rosuvastatin (CRESTOR) 10 MG tablet Take 10 mg by mouth daily.    [provider]  triamcinolone cream (KENALOG) 0.1 % Apply 1 application topically 2 (two) times daily. 06/20/19   Sheffield, Harvin Hazel R, PA-C  sucralfate (CARAFATE) 1 g tablet Take 1 g by mouth 4 (four) times daily -  with meals and at bedtime. Patient not taking: Reported on 08/17/2020  08/17/20  [provider]      Allergies    Patient has no known allergies.    Review of Systems   Review of Systems  All other systems reviewed and are negative.   Physical Exam Updated Vital Signs BP (!) 168/96   Pulse (!) 133   Temp 98.1 F (36.7 C)   Resp 20   Ht 5\' 4"  (1.626 m)   Wt 75.8 kg   SpO2 100%   BMI 28.67 kg/m  Physical Exam Vitals and nursing note reviewed.  Constitutional:      General: She is not in acute distress.    Appearance: She is well-developed.  HENT:     Head: Atraumatic.  Eyes:     Conjunctiva/sclera: Conjunctivae normal.  Pulmonary:     Effort: Pulmonary effort is  normal.  Musculoskeletal:        General: Signs of injury (Left arm: Close deformity noted about the left wrist with decreased range motion.  Radial pulse 2+.  Brisk cap refill to fingers.  A 4 cm vertical laceration noted to the lateral mid forearm) present.     Cervical back: Neck supple.  Skin:    Findings: No rash.  Neurological:     Mental Status: She is alert.  Psychiatric:        Mood and Affect: Mood normal.     ED Results / Procedures / Treatments   Labs (all labs ordered are listed, but only abnormal results are displayed) Labs Reviewed - No data to display  EKG None  Radiology DG Forearm Left  Result Date: 08/12/2022 CLINICAL DATA:  Postreduction EXAM: LEFT FOREARM - 2 VIEW COMPARISON:  08/12/2022 FINDINGS: Interval casting of the left forearm and wrist. There is improved  alignment across the distal left radial fracture with continued posterior displacement of the distal fragments. IMPRESSION: Improved alignment.  Continued posterior displacement. Electronically Signed   By: Charlett Nose M.D.   On: 08/12/2022 23:15   DG Forearm Left  Result Date: 08/12/2022 CLINICAL DATA:  Left wrist injury EXAM: LEFT FOREARM - 2 VIEW COMPARISON:  None Available. FINDINGS: Acute dorsally displaced and angulated, intra-articular, comminuted distal radial fracture. No acute displaced fracture or dislocation more distally along the radius and ulna. Elbow is grossly unremarkable. There is no evidence of fracture or other focal bone lesions. Soft tissues are unremarkable. IMPRESSION: 1. Acute dorsally displaced and angulated, intra-articular, comminuted distal radial fracture. 2. No acute displaced fracture or dislocation more distally along the radius and ulna. Electronically Signed   By: Tish Frederickson M.D.   On: 08/12/2022 21:17   DG Wrist Complete Left  Result Date: 08/12/2022 CLINICAL DATA:  injury EXAM: LEFT WRIST - COMPLETE 3+ VIEW COMPARISON:  X-ray left hand 08/17/2020 FINDINGS: Acute dorsally displaced and angulated, intra-articular, comminuted distal radial fracture. No dislocation. Old healed fifth digit metacarpal fracture. There is no evidence of arthropathy or other focal bone abnormality. Subcutaneus soft tissue edema. IMPRESSION: Acute dorsally displaced and angulated, intra-articular, comminuted distal radial fracture. Electronically Signed   By: Tish Frederickson M.D.   On: 08/12/2022 21:09    Procedures .Ortho Injury Treatment  Date/Time: 08/12/2022 9:34 PM  Performed by: Fayrene Helper, PA-C Authorized by: Fayrene Helper, PA-C   Consent:    Consent obtained:  Verbal   Consent given by:  Patient   Risks discussed:  Fracture, recurrent dislocation, restricted joint movement and nerve damage   Alternatives discussed:  ReferralInjury location: wrist Location details: left  wrist Injury type: fracture-dislocation Pre-procedure neurovascular assessment: neurovascularly intact Pre-procedure distal perfusion: normal Pre-procedure neurological function: normal Pre-procedure range of motion: reduced Anesthesia: hematoma block  Anesthesia: Local anesthesia used: yes Local Anesthetic: lidocaine 2% with epinephrine Anesthetic total: 6 mL  Patient sedated: NoManipulation performed: yes   .Marland KitchenLaceration Repair  Date/Time: 08/12/2022 9:36 PM  Performed by: Fayrene Helper, PA-C Authorized by: Fayrene Helper, PA-C   Consent:    Consent obtained:  Verbal   Consent given by:  Patient   Risks, benefits, and alternatives were discussed: yes     Risks discussed:  Infection and need for additional repair   Alternatives discussed:  No treatment Universal protocol:    Procedure explained and questions answered to patient or proxy's satisfaction: yes     Relevant documents present and verified: yes     Patient  identity confirmed:  Verbally with patient and arm band Anesthesia:    Anesthesia method:  Local infiltration   Local anesthetic:  Lidocaine 2% WITH epi Laceration details:    Location:  Shoulder/arm   Shoulder/arm location:  L lower arm   Length (cm):  4   Depth (mm):  3 Pre-procedure details:    Preparation:  Patient was prepped and draped in usual sterile fashion and imaging obtained to evaluate for foreign bodies Exploration:    Limited defect created (wound extended): no     Hemostasis achieved with:  Epinephrine   Imaging obtained: x-ray     Imaging outcome: foreign body not noted     Wound exploration: wound explored through full range of motion and entire depth of wound visualized     Wound extent: no signs of injury and no underlying fracture     Contaminated: no   Treatment:    Area cleansed with:  Saline and povidone-iodine   Amount of cleaning:  Standard   Irrigation solution:  Sterile saline   Irrigation method:  Pressure wash   Debridement:   None   Undermining:  None   Scar revision: no   Skin repair:    Repair method:  Sutures   Suture size:  5-0   Suture material:  Chromic gut   Suture technique:  Simple interrupted   Number of sutures:  6 Approximation:    Approximation:  Close Repair type:    Repair type:  Intermediate Post-procedure details:    Dressing:  Non-adherent dressing   Procedure completion:  Tolerated well, no immediate complications     Medications Ordered in ED Medications  lidocaine-EPINEPHrine (XYLOCAINE W/EPI) 2 %-1:200000 (PF) injection 10 mL (has no administration in time range)  HYDROmorphone (DILAUDID) injection 0.5 mg (0.5 mg Intramuscular Given 08/12/22 2048)    ED Course/ Medical Decision Making/ A&P                             Medical Decision Making Amount and/or Complexity of Data Reviewed Radiology: ordered.  Risk Prescription drug management.   BP 132/89 (BP Location: Right Arm)   Pulse 76   Temp 98.1 F (36.7 C)   Resp 18   Ht 5\' 4"  (1.626 m)   Wt 75.8 kg   SpO2 100%   BMI 28.67 kg/m   24:7 PM 62 year old female significant history of hypertension, presenting for evaluation of wrist injury.  Patient reports she was outside this afternoon and she felt like an old oak tree outside her house was going to fall on her.  She tries to run away and ran into the wall on the side of her house, injuring her left wrist in the process.  She did struck her face but no loss of consciousness.  Her pain is primarily to her left wrist.  She also suffered a laceration to her forearm.  She denies any elbow or shoulder pain.  She denies any dental pain.  No headache or neck pain.  No specific treatment tried prior to arrival.  She is up-to-date with tetanus.  On exam, patient has close deformity about the left wrist with tenderness to palpation and crepitus noted.  She has a laceration to the lateral aspect of her mid forearm not actively bleeding and no foreign body noted.  She has sensation  intact at the injury.  No pain to left elbow or left shoulder.  Multiple attempts to obtain IV  access without success.  Will provide opiate pain medication IM for better pain control.  9:32 PM -Labs including CBC and BMP considered but not performed as I anticipate pt will be able to discharge home -The patient was maintained on a cardiac monitor.  I personally viewed and interpreted the cardiac monitored which showed an underlying rhythm of: sinus tachycardia -Imaging independently viewed and interpreted by me and I agree with radiologist's interpretation.  Result remarkable for Xray of L wrist showing acute dorsally displaced and angulated, intra-articular comminuted distal radial fx.  This is a closed injury -This patient presents to the ED for concern of arm injury, this involves an extensive number of treatment options, and is a complaint that carries with it a high risk of complications and morbidity.  The differential diagnosis includes fx, dislocation, laceration, sprain, strain -Co morbidities that complicate the patient evaluation includes HTN, thyroid disease -Treatment includes dilaudid, sutures repair, hematoma block -Reevaluation of the patient after these medicines showed that the patient improved -PCP office notes or outside notes reviewed -Discussion with specialist such as hand specialist considered.  I discussed with Dr. Fredderick Phenix.  We felt since pt f/u with [facility], I recommend f/u with them outpt for further care.   -Escalation to admission/observation considered: patients feels much better, is comfortable with discharge, and will follow up with orthopedist -Prescription medication considered, patient comfortable with percocet -Social Determinant of Health considered   9:35 PM Patient suffered laceration to left forearm which was repaired by me using absorbable sutures.  Patient tolerates well.  I also performed a hematoma block to her left wrist injury and will place an in a  fingertrap to help improve alignment prior to splinting of the wrist.  11:22 PM I performed a hematoma block and patient felt much better with his symptoms.  I was able to perform laceration repair, as well as performing closed reduction of her wrist and application of her sugar-tong splint.  Sling provided.  Repeat x-ray shows improvement of alignment but there is still a dorsal angulation.  At this time I did check a sensation postreduction and patient is neurovascular intact.  Encourage patient to follow-up with [facility] for per her request for close management.  Patient voiced understanding and agrees with plan.        Final Clinical Impression(s) / ED Diagnoses Final diagnoses:  Closed traumatic displaced fracture of distal end of left radius, initial encounter  Forearm laceration, left, initial encounter    Rx / DC Orders ED Discharge Orders          Ordered    oxyCODONE-acetaminophen (PERCOCET/ROXICET) 5-325 MG tablet  Every 6 hours PRN        08/12/22 2327              Fayrene Helper, PA-C 08/12/22 2336    Rolan Bucco, MD 08/20/22 [phone]

## 2022-08-12 NOTE — Discharge Instructions (Addendum)
You have been evaluated for your injury.  You have suffered a broken left wrist that will require close evaluation and management by your hand specialist from [facility].  Please give them a call tomorrow for close follow-up.  Take pain medication as prescribed.  Your sutures are absorbable but monitor the skin for any signs of infection.  Return if you have any concern.

## 2022-08-12 NOTE — ED Notes (Signed)
Pts daughter Morrie Sheldon updated regarding pts status via phone by this RN

## 2022-08-12 NOTE — ED Notes (Signed)
ED Provider at bedside. 

## 2022-08-12 NOTE — ED Triage Notes (Addendum)
Pt POV c/o L wrist injury. Pt reports being outside and feeling like the tree she was under was going to fall so she started running and ran into the wall on the side of her house. Obvious deformity noted to L wrist, 3 inch laceration also noted, bleeding controlled. Pulses present bilaterally. Ice applied upon arrival.

## 2022-08-13 DIAGNOSIS — S52502A Unspecified fracture of the lower end of left radius, initial encounter for closed fracture: Secondary | ICD-10-CM | POA: Diagnosis not present

## 2022-08-25 DIAGNOSIS — S52552A Other extraarticular fracture of lower end of left radius, initial encounter for closed fracture: Secondary | ICD-10-CM | POA: Diagnosis not present

## 2022-08-25 DIAGNOSIS — Y999 Unspecified external cause status: Secondary | ICD-10-CM | POA: Diagnosis not present

## 2022-08-25 DIAGNOSIS — X58XXXA Exposure to other specified factors, initial encounter: Secondary | ICD-10-CM | POA: Diagnosis not present

## 2022-08-25 DIAGNOSIS — G8918 Other acute postprocedural pain: Secondary | ICD-10-CM | POA: Diagnosis not present

## 2022-09-07 DIAGNOSIS — S52502D Unspecified fracture of the lower end of left radius, subsequent encounter for closed fracture with routine healing: Secondary | ICD-10-CM | POA: Diagnosis not present

## 2022-09-28 DIAGNOSIS — S52502A Unspecified fracture of the lower end of left radius, initial encounter for closed fracture: Secondary | ICD-10-CM | POA: Diagnosis not present

## 2022-10-08 DIAGNOSIS — M25642 Stiffness of left hand, not elsewhere classified: Secondary | ICD-10-CM | POA: Diagnosis not present

## 2022-10-13 DIAGNOSIS — M25532 Pain in left wrist: Secondary | ICD-10-CM | POA: Diagnosis not present

## 2022-10-15 DIAGNOSIS — M25532 Pain in left wrist: Secondary | ICD-10-CM | POA: Diagnosis not present

## 2022-10-22 DIAGNOSIS — M25532 Pain in left wrist: Secondary | ICD-10-CM | POA: Diagnosis not present

## 2022-10-27 DIAGNOSIS — M25532 Pain in left wrist: Secondary | ICD-10-CM | POA: Diagnosis not present

## 2022-10-29 DIAGNOSIS — M25532 Pain in left wrist: Secondary | ICD-10-CM | POA: Diagnosis not present

## 2022-11-04 DIAGNOSIS — M25632 Stiffness of left wrist, not elsewhere classified: Secondary | ICD-10-CM | POA: Diagnosis not present

## 2022-11-09 DIAGNOSIS — S52502D Unspecified fracture of the lower end of left radius, subsequent encounter for closed fracture with routine healing: Secondary | ICD-10-CM | POA: Diagnosis not present

## 2023-01-20 DIAGNOSIS — R5383 Other fatigue: Secondary | ICD-10-CM | POA: Diagnosis not present

## 2023-01-20 DIAGNOSIS — Z03818 Encounter for observation for suspected exposure to other biological agents ruled out: Secondary | ICD-10-CM | POA: Diagnosis not present

## 2023-01-20 DIAGNOSIS — J029 Acute pharyngitis, unspecified: Secondary | ICD-10-CM | POA: Diagnosis not present

## 2023-01-20 DIAGNOSIS — R52 Pain, unspecified: Secondary | ICD-10-CM | POA: Diagnosis not present

## 2023-05-14 ENCOUNTER — Other Ambulatory Visit: Payer: Self-pay | Admitting: Family Medicine

## 2023-05-14 DIAGNOSIS — Z1231 Encounter for screening mammogram for malignant neoplasm of breast: Secondary | ICD-10-CM

## 2023-05-17 ENCOUNTER — Other Ambulatory Visit: Payer: Self-pay | Admitting: Family Medicine

## 2023-05-17 ENCOUNTER — Other Ambulatory Visit (HOSPITAL_BASED_OUTPATIENT_CLINIC_OR_DEPARTMENT_OTHER): Payer: Self-pay | Admitting: Family Medicine

## 2023-05-17 DIAGNOSIS — E782 Mixed hyperlipidemia: Secondary | ICD-10-CM

## 2023-05-17 DIAGNOSIS — M85852 Other specified disorders of bone density and structure, left thigh: Secondary | ICD-10-CM

## 2023-05-25 ENCOUNTER — Ambulatory Visit: Payer: BC Managed Care – PPO

## 2023-06-02 ENCOUNTER — Ambulatory Visit
Admission: RE | Admit: 2023-06-02 | Discharge: 2023-06-02 | Disposition: A | Discharge status: Home / Self Care | Payer: BC Managed Care – PPO | Source: Ambulatory Visit | Attending: Family Medicine | Admitting: Family Medicine

## 2023-06-02 DIAGNOSIS — Z1231 Encounter for screening mammogram for malignant neoplasm of breast: Secondary | ICD-10-CM

## 2023-06-09 ENCOUNTER — Other Ambulatory Visit: Payer: Self-pay | Admitting: Family Medicine

## 2023-06-09 DIAGNOSIS — R928 Other abnormal and inconclusive findings on diagnostic imaging of breast: Secondary | ICD-10-CM

## 2023-06-17 ENCOUNTER — Ambulatory Visit
Admission: RE | Admit: 2023-06-17 | Discharge: 2023-06-17 | Disposition: A | Discharge status: Home / Self Care | Source: Ambulatory Visit | Attending: Family Medicine | Admitting: Family Medicine

## 2023-06-17 ENCOUNTER — Other Ambulatory Visit: Payer: Self-pay | Admitting: Family Medicine

## 2023-06-17 DIAGNOSIS — R928 Other abnormal and inconclusive findings on diagnostic imaging of breast: Secondary | ICD-10-CM

## 2023-06-17 DIAGNOSIS — N6310 Unspecified lump in the right breast, unspecified quadrant: Secondary | ICD-10-CM

## 2023-06-23 ENCOUNTER — Other Ambulatory Visit

## 2023-06-24 ENCOUNTER — Other Ambulatory Visit

## 2023-06-30 ENCOUNTER — Ambulatory Visit
Admission: RE | Admit: 2023-06-30 | Discharge: 2023-06-30 | Disposition: A | Discharge status: Home / Self Care | Source: Ambulatory Visit | Attending: Family Medicine | Admitting: Family Medicine

## 2023-06-30 DIAGNOSIS — R928 Other abnormal and inconclusive findings on diagnostic imaging of breast: Secondary | ICD-10-CM

## 2023-06-30 DIAGNOSIS — N6310 Unspecified lump in the right breast, unspecified quadrant: Secondary | ICD-10-CM

## 2023-06-30 HISTORY — PX: BREAST BIOPSY: SHX20

## 2023-07-01 ENCOUNTER — Ambulatory Visit (HOSPITAL_BASED_OUTPATIENT_CLINIC_OR_DEPARTMENT_OTHER)
Admission: RE | Admit: 2023-07-01 | Discharge: 2023-07-01 | Disposition: A | Discharge status: Home / Self Care | Payer: BC Managed Care – PPO | Source: Ambulatory Visit | Attending: Family Medicine | Admitting: Family Medicine

## 2023-07-01 DIAGNOSIS — E782 Mixed hyperlipidemia: Secondary | ICD-10-CM | POA: Insufficient documentation

## 2023-07-01 LAB — SURGICAL PATHOLOGY

## 2024-01-03 ENCOUNTER — Encounter (HOSPITAL_COMMUNITY): Payer: Self-pay

## 2024-01-03 ENCOUNTER — Other Ambulatory Visit (HOSPITAL_COMMUNITY)

## 2024-01-05 ENCOUNTER — Other Ambulatory Visit: Payer: BC Managed Care – PPO

## 2024-05-02 ENCOUNTER — Other Ambulatory Visit: Payer: Self-pay | Admitting: Family Medicine

## 2024-05-02 DIAGNOSIS — Z1231 Encounter for screening mammogram for malignant neoplasm of breast: Secondary | ICD-10-CM

## 2024-06-02 ENCOUNTER — Ambulatory Visit
